# Patient Record
Sex: Female | Born: 1985
Health system: Southern US, Community
[De-identification: ages and names within clinical notes are randomized; demographics above are authoritative.]

## PROBLEM LIST (undated history)

## (undated) ENCOUNTER — Inpatient Hospital Stay (HOSPITAL_COMMUNITY): Payer: Medicaid Other

## (undated) DIAGNOSIS — Z789 Other specified health status: Secondary | ICD-10-CM

## (undated) HISTORY — PX: NO PAST SURGERIES: SHX2092

---

## 2018-01-08 DIAGNOSIS — Z049 Encounter for examination and observation for unspecified reason: Secondary | ICD-10-CM | POA: Diagnosis not present

## 2018-01-09 DIAGNOSIS — Z1388 Encounter for screening for disorder due to exposure to contaminants: Secondary | ICD-10-CM | POA: Diagnosis not present

## 2018-01-09 DIAGNOSIS — Z111 Encounter for screening for respiratory tuberculosis: Secondary | ICD-10-CM | POA: Diagnosis not present

## 2018-01-09 DIAGNOSIS — Z049 Encounter for examination and observation for unspecified reason: Secondary | ICD-10-CM | POA: Diagnosis not present

## 2018-01-09 DIAGNOSIS — Z0389 Encounter for observation for other suspected diseases and conditions ruled out: Secondary | ICD-10-CM | POA: Diagnosis not present

## 2018-01-09 DIAGNOSIS — Z206 Contact with and (suspected) exposure to human immunodeficiency virus [HIV]: Secondary | ICD-10-CM | POA: Diagnosis not present

## 2018-01-09 DIAGNOSIS — Z3009 Encounter for other general counseling and advice on contraception: Secondary | ICD-10-CM | POA: Diagnosis not present

## 2018-01-09 DIAGNOSIS — Z23 Encounter for immunization: Secondary | ICD-10-CM | POA: Diagnosis not present

## 2018-01-21 DIAGNOSIS — Z30013 Encounter for initial prescription of injectable contraceptive: Secondary | ICD-10-CM | POA: Diagnosis not present

## 2018-01-21 DIAGNOSIS — Z01419 Encounter for gynecological examination (general) (routine) without abnormal findings: Secondary | ICD-10-CM | POA: Diagnosis not present

## 2018-01-21 DIAGNOSIS — Z3200 Encounter for pregnancy test, result unknown: Secondary | ICD-10-CM | POA: Diagnosis not present

## 2018-02-03 ENCOUNTER — Other Ambulatory Visit: Payer: Self-pay | Admitting: Internal Medicine

## 2018-02-03 ENCOUNTER — Ambulatory Visit
Admission: RE | Admit: 2018-02-03 | Discharge: 2018-02-03 | Disposition: A | Discharge status: Home / Self Care | Payer: Self-pay | Source: Ambulatory Visit | Attending: Internal Medicine | Admitting: Internal Medicine

## 2018-02-03 DIAGNOSIS — R7612 Nonspecific reaction to cell mediated immunity measurement of gamma interferon antigen response without active tuberculosis: Secondary | ICD-10-CM | POA: Diagnosis not present

## 2018-02-03 DIAGNOSIS — R7611 Nonspecific reaction to tuberculin skin test without active tuberculosis: Secondary | ICD-10-CM

## 2018-02-04 ENCOUNTER — Ambulatory Visit (HOSPITAL_COMMUNITY)
Admission: EM | Admit: 2018-02-04 | Discharge: 2018-02-04 | Disposition: A | Discharge status: Home / Self Care | Payer: Medicaid Other | Attending: Family Medicine | Admitting: Family Medicine

## 2018-02-04 ENCOUNTER — Encounter (HOSPITAL_COMMUNITY): Payer: Self-pay

## 2018-02-04 ENCOUNTER — Other Ambulatory Visit: Payer: Self-pay

## 2018-02-04 DIAGNOSIS — S61412A Laceration without foreign body of left hand, initial encounter: Secondary | ICD-10-CM | POA: Diagnosis not present

## 2018-02-04 DIAGNOSIS — W260XXA Contact with knife, initial encounter: Secondary | ICD-10-CM

## 2018-02-04 MED ORDER — LIDOCAINE HCL 2 % IJ SOLN
INTRAMUSCULAR | Status: AC
  Filled 2018-02-04: qty 20

## 2018-02-04 NOTE — Discharge Instructions (Signed)

## 2018-02-04 NOTE — ED Triage Notes (Signed)
Pt was cutting a bone and ended up stabbing her left hand with a knife.

## 2018-02-05 DIAGNOSIS — S61412A Laceration without foreign body of left hand, initial encounter: Secondary | ICD-10-CM | POA: Diagnosis not present

## 2018-02-05 NOTE — ED Provider Notes (Signed)
[facility]    CSN: [id number] Arrival date & time: 02/04/18  1124     History   Chief Complaint Chief Complaint  Patient presents with  . Laceration    HPI Gina Walton is a 32 y.o. female no significant past medical history presenting today for evaluation of laceration to her left hand.  Patient was trying to cut a bone while cooking and accidentally cut her left hand.  Incident happened a couple hours ago.  Patient denies difficulty moving her fingers.  T[place]anus was believed to be updated upon arrival to the contrary approximately 2 months ago.  She has not cleaned the wound yet.  Patient is accompanied by refugee advisor/helper.  HPI  History reviewed. No pertinent past medical history.  There are no active problems to display for this patient.   History reviewed. No pertinent surgical history.  OB History   None      Home Medications    Prior to Admission medications   Not on File    Family History History reviewed. No pertinent family history.  Social History Social History   Tobacco Use  . Smoking status: Never Smoker  . Smokeless tobacco: Current User  Substance Use Topics  . Alcohol use: Not Currently  . Drug use: Not Currently     Allergies   Patient has no allergy information on record.   Review of Systems Review of Systems  Constitutional: Negative for fatigue and fever.  Eyes: Negative for visual disturbance.  Respiratory: Negative for shortness of breath.   Cardiovascular: Negative for chest pain.  Gastrointestinal: Negative for abdominal pain, nausea and vomiting.  Musculoskeletal: Negative for arthralgias and joint swelling.  Skin: Positive for wound. Negative for color change and rash.  Neurological: Negative for dizziness, weakness, light-headedness and headaches.     Physical Exam Triage Vital Signs ED Triage Vitals  Enc Vitals Group     BP 02/04/18 1208 112/78     Pulse Rate 02/04/18 1208 83     Resp  02/04/18 1208 18     Temp 02/04/18 1208 97.8 F (36.6 C)     Temp Source 02/04/18 1208 Oral     SpO2 02/04/18 1208 100 %     Weight 02/04/18 1209 208 lb (94.3 kg)     Height --      Head Circumference --      Peak Flow --      Pain Score 02/04/18 1244 2     Pain Loc --      Pain Edu? --      Excl. in GC? --    No data found.  Updated Vital Signs BP 112/78 (BP Location: Left Arm)   Pulse 83   Temp 97.8 F (36.6 C) (Oral)   Resp 18   Wt 208 lb (94.3 kg)   SpO2 100%   Visual Acuity Right Eye Distance:   Left Eye Distance:   Bilateral Distance:    Right Eye Near:   Left Eye Near:    Bilateral Near:     Physical Exam  Constitutional: She is oriented to person, place, and time. She appears well-developed and well-nourished.  No acute distress  HENT:  Head: Normocephalic and atraumatic.  Nose: Nose normal.  Eyes: Conjunctivae are normal.  Neck: Neck supple.  Cardiovascular: Normal rate.  Pulmonary/Chest: Effort normal. No respiratory distress.  Abdominal: She exhibits no distension.  Musculoskeletal: Normal range of motion.  Full active range of motion of all fingers on left  hand index wrist  Neurological: She is alert and oriented to person, place, and time.  Skin: Skin is warm and dry.  1.5 cm linear laceration, 2 thenar eminence on left hand, relatively superficial, but is exposing subcutaneous fat, bleeding relatively controlled, but easily bleeds when manipulated  Psychiatric: She has a normal mood and affect.  Nursing note and vitals reviewed.    UC Treatments / Results  Labs (all labs ordered are listed, but only abnormal results are displayed) Labs Reviewed - No data to display  EKG None  Radiology Dg Chest 1 View  Result Date: 02/04/2018 CLINICAL DATA:  Positive PPD EXAM: CHEST  1 VIEW COMPARISON:  None. FINDINGS: The heart size and mediastinal contours are within normal limits. Both lungs are clear. The visualized skeletal structures are  unremarkable. IMPRESSION: No active disease. Electronically Signed   By: Jasmine Pang M.D.   On: 02/04/2018 03:57    Procedures Laceration Repair Date/Time: 02/05/2018 10:02 AM Performed by: Wieters, Junius Creamer, PA-C Authorized by: Eustace Moore, MD   Consent:    Consent obtained:  Verbal   Consent given by:  Patient   Risks discussed:  Infection, pain and poor cosmetic result   Alternatives discussed:  No treatment Anesthesia (see MAR for exact dosages):    Anesthesia method:  Local infiltration   Local anesthetic:  Lidocaine 2% w/o epi Laceration details:    Location:  Hand   Hand location:  L palm   Length (cm):  1.5 Repair type:    Repair type:  Simple Pre-procedure details:    Preparation:  Patient was prepped and draped in usual sterile fashion Exploration:    Hemostasis achieved with:  Direct pressure   Wound exploration: wound explored through full range of motion     Wound extent: no foreign bodies/material noted, no muscle damage noted and no tendon damage noted   Treatment:    Area cleansed with:  Soap and water   Amount of cleaning:  Standard   Visualized foreign bodies/material removed: no   Skin repair:    Repair method:  Sutures   Suture size:  4-0   Suture material:  Prolene   Suture technique:  Simple interrupted   Number of sutures:  3 Approximation:    Approximation:  Close Post-procedure details:    Dressing: Gauze and Coban.   Patient tolerance of procedure:  Tolerated well, no immediate complications   (including critical care time)  Medications Ordered in UC Medications - No data to display  Initial Impression / Assessment and Plan / UC Course  I have reviewed the triage vital signs and the nursing notes.  Pertinent labs & imaging results that were available during my care of the patient were reviewed by me and considered in my medical decision making (see chart for details).     Small laceration to left hand, does not appear to  involve any tendons or ligaments, laceration repaired with 3 sutures.  Return to have those removed in 7 to 10 days.  Discussed wound care, keeping clean and dry.  Monitoring for signs of infection.Discussed strict return precautions. Patient verbalized understanding and is agreeable with plan.  Final Clinical Impressions(s) / UC Diagnoses   Final diagnoses:  Laceration of left hand without foreign body, initial encounter     Discharge Instructions     WOUND CARE Please return in 7 days to have your stitches/staples removed or sooner if you have concerns. Marland Kitchen Keep area clean and dry for 24  hours. Do not remove bandage, if applied. . After 24 hours, remove bandage and wash wound gently with mild soap and warm water. Reapply a new bandage after cleaning wound, if directed. . Continue daily cleansing with soap and water until stitches/staples are removed. . Do not apply any ointments or creams to the wound while stitches/staples are in place, as this may cause delayed healing. . Notify the office if you experience any of the following signs of infection: Swelling, redness, pus drainage, streaking, fever >101.0 F . Notify the office if you experience excessive bleeding that does not stop after 15-20 minutes of constant, firm pressure.    ED Prescriptions    None     Controlled Substance Prescriptions [place] Controlled Substance Registry consulted? Not Applicable   Lew Dawes, [place] 02/05/18 1004

## 2018-02-06 ENCOUNTER — Ambulatory Visit: Payer: Self-pay | Admitting: Family Medicine

## 2018-02-28 DIAGNOSIS — R7612 Nonspecific reaction to cell mediated immunity measurement of gamma interferon antigen response without active tuberculosis: Secondary | ICD-10-CM | POA: Diagnosis not present

## 2018-03-13 ENCOUNTER — Ambulatory Visit: Payer: Medicaid Other | Attending: Family Medicine | Admitting: Family Medicine

## 2018-03-13 ENCOUNTER — Encounter: Payer: Self-pay | Admitting: Family Medicine

## 2018-03-13 VITALS — BP 110/74 | HR 85 | Temp 97.8°F | Ht 62.0 in | Wt 196.4 lb

## 2018-03-13 DIAGNOSIS — H539 Unspecified visual disturbance: Secondary | ICD-10-CM

## 2018-03-13 DIAGNOSIS — H5500 Unspecified nystagmus: Secondary | ICD-10-CM

## 2018-03-13 DIAGNOSIS — R51 Headache: Secondary | ICD-10-CM | POA: Insufficient documentation

## 2018-03-13 NOTE — Progress Notes (Signed)
Patient is here to establish care.   Pt. Is concern with her left eye, pt. Stated there is something in her eye tissue.   Pt. Stated she see things more dark with her left eye.

## 2018-03-13 NOTE — Progress Notes (Signed)
Subjective:    Patient ID: Gina NiemannSabine Walton, female    DOB: 1985/10/03, 32 y.o.   MRN: [id number]   Due to a language barrier, [employer] video interpretation system used at today's visit  HPI       32 year old female new to the practice.  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  Patient however states that over time this is getting larger.  Patient states that she has issues with blurred or dark vision in the left eye.  Patient with occasional frontal headache.  Patient denies dizziness.  Patient has had no prior injury to her eye.  Patient does not have any family history of eye disorders.  Patient states that she was born at full-term.  Patient states that she does not feel the sensation of eye movement generally.      Patient reports no significant past medical history.  Patient reports no known drug allergies.  Patient reports no family history of diabetes, hypertension, heart disease or cancer.  Patient reports no significant illnesses in her family.  Patient has had no surgery.  Patient does not drink or smoke.    Review of Systems  Constitutional: Negative for chills, fatigue and fever.  HENT: Negative for congestion, hearing loss, postnasal drip, rhinorrhea, sinus pressure, sore throat, tinnitus and trouble swallowing.   Eyes: Positive for photophobia, redness (occasional) and visual disturbance. Negative for pain, discharge and itching.  Respiratory: Negative for cough and shortness of breath.   Cardiovascular: Negative for chest pain, palpitations and leg swelling.  Gastrointestinal: Negative for abdominal pain and nausea.  Endocrine: Negative for polydipsia, polyphagia and polyuria.  Genitourinary: Negative for dysuria and frequency.  Musculoskeletal: Negative for arthralgias, back pain, gait problem, joint swelling and myalgias.  Neurological: Positive for headaches (occasional). Negative for dizziness, tremors, seizures, syncope, facial  asymmetry, speech difficulty, weakness, light-headedness and numbness.       Objective:   Physical Exam BP 110/74 (BP Location: Left Arm, Patient Position: Sitting, Cuff Size: Normal)   Pulse 85   Temp 97.8 F (36.6 C) (Oral)   Ht 5\' 2"  (1.575 m)   Wt 196 lb 6.4 oz (89.1 kg)   LMP 02/15/2018   SpO2 100%   BMI 35.92 kg/m Nurse's notes and vital signs reviewed General-well-nourished, well-developed female in no acute distress EENT- patient with normal conjunctiva with the exception of appearance of extra tissue/growth of conjunctiva onto the lower portion of the left medial iris/lens.  Patient has visible resting nystagmus of the left eye and on examination, when patient is asked to gaze to the left, patient has increase in rapid nystagmus and cannot hold left gaze for very long.  Upon questioning, patient states that she gets a pulling sensation at the area of the tissue growth on the left medial eye when she tries to gaze to the left.  Patient's TMs are gray bilaterally, patient with mild edema of the nasal turbinates, normal oropharynx Neck-supple, no lymphadenopathy, no thyromegaly, no carotid bruit.  Patient with some mild increase in fatty tissue in the anterior neck giving appearance of mild thyromegaly Lungs-clear to auscultation bilaterally Cardiovascular-regular rate and rhythm Abdomen-soft, nontender. Neuro- cranial nerves II through XII are grossly intact with the exception of nystagmus      Assessment & Plan:  .1. 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  Patient is being referred to ophthalmology. - Ambulatory referral to Ophthalmology  2. Nystagmus Patient has continuous nystagmus that is made worse with left gaze.  Patient is being referred to ophthalmology and hopefully to a neuro-ophthalmologist.  Patient may need  evaluation for prior stroke/brain injury if no other causes found for her symptoms - Ambulatory referral to Ophthalmology  An After Visit Summary was printed and given to the patient.  Return for follow-up as needed.

## 2018-05-13 DIAGNOSIS — H5501 Congenital nystagmus: Secondary | ICD-10-CM | POA: Diagnosis not present

## 2018-05-13 DIAGNOSIS — H53002 Unspecified amblyopia, left eye: Secondary | ICD-10-CM | POA: Diagnosis not present

## 2018-05-13 DIAGNOSIS — H538 Other visual disturbances: Secondary | ICD-10-CM | POA: Diagnosis not present

## 2018-05-13 DIAGNOSIS — H1189 Other specified disorders of conjunctiva: Secondary | ICD-10-CM | POA: Diagnosis not present

## 2018-05-13 DIAGNOSIS — Q159 Congenital malformation of eye, unspecified: Secondary | ICD-10-CM | POA: Diagnosis not present

## 2018-05-19 DIAGNOSIS — H5213 Myopia, bilateral: Secondary | ICD-10-CM | POA: Diagnosis not present

## 2018-06-25 DIAGNOSIS — H5201 Hypermetropia, right eye: Secondary | ICD-10-CM | POA: Diagnosis not present

## 2018-06-25 DIAGNOSIS — H52223 Regular astigmatism, bilateral: Secondary | ICD-10-CM | POA: Diagnosis not present

## 2018-10-13 DIAGNOSIS — Z3200 Encounter for pregnancy test, result unknown: Secondary | ICD-10-CM | POA: Diagnosis not present

## 2018-10-13 DIAGNOSIS — Z23 Encounter for immunization: Secondary | ICD-10-CM | POA: Diagnosis not present

## 2018-11-21 ENCOUNTER — Other Ambulatory Visit: Payer: Self-pay

## 2018-11-21 ENCOUNTER — Ambulatory Visit: Payer: Medicaid Other | Attending: Family Medicine | Admitting: Family Medicine

## 2018-11-21 ENCOUNTER — Encounter: Payer: Self-pay | Admitting: Family Medicine

## 2018-11-21 ENCOUNTER — Other Ambulatory Visit (HOSPITAL_COMMUNITY)
Admission: RE | Admit: 2018-11-21 | Discharge: 2018-11-21 | Disposition: A | Discharge status: Home / Self Care | Payer: Medicaid Other | Source: Ambulatory Visit | Attending: Family Medicine | Admitting: Family Medicine

## 2018-11-21 VITALS — BP 113/77 | HR 90 | Temp 98.5°F | Ht 62.0 in | Wt 211.2 lb

## 2018-11-21 DIAGNOSIS — Z6838 Body mass index (BMI) 38.0-38.9, adult: Secondary | ICD-10-CM | POA: Insufficient documentation

## 2018-11-21 DIAGNOSIS — Z124 Encounter for screening for malignant neoplasm of cervix: Secondary | ICD-10-CM | POA: Diagnosis not present

## 2018-11-21 NOTE — Patient Instructions (Signed)
° °Calorie Counting for Weight Loss °Calories are units of energy. Your body needs a certain amount of calories from food to keep you going throughout the day. When you eat more calories than your body needs, your body stores the extra calories as fat. When you eat fewer calories than your body needs, your body burns fat to get the energy it needs. °Calorie counting means keeping track of how many calories you eat and drink each day. Calorie counting can be helpful if you need to lose weight. If you make sure to eat fewer calories than your body needs, you should lose weight. Ask your health care provider what a healthy weight is for you. °For calorie counting to work, you will need to eat the right number of calories in a day in order to lose a healthy amount of weight per week. A dietitian can help you determine how many calories you need in a day and will give you suggestions on how to reach your calorie goal. °· A healthy amount of weight to lose per week is usually 1-2 lb (0.5-0.9 kg). This usually means that your daily calorie intake should be reduced by 500-750 calories. °· Eating 1,200 - 1,500 calories per day can help most women lose weight. °· Eating 1,500 - 1,800 calories per day can help most men lose weight. °What is my plan? °My goal is to have __________ calories per day. °If I have this many calories per day, I should lose around __________ pounds per week. °What do I need to know about calorie counting? °In order to meet your daily calorie goal, you will need to: °· Find out how many calories are in each food you would like to eat. Try to do this before you eat. °· Decide how much of the food you plan to eat. °· Write down what you ate and how many calories it had. Doing this is called keeping a food log. °To successfully lose weight, it is important to balance calorie counting with a healthy lifestyle that includes regular activity. Aim for 150 minutes of moderate exercise (such as walking) or 75  minutes of vigorous exercise (such as running) each week. °Where do I find calorie information? ° °The number of calories in a food can be found on a Nutrition Facts label. If a food does not have a Nutrition Facts label, try to look up the calories online or ask your dietitian for help. °Remember that calories are listed per serving. If you choose to have more than one serving of a food, you will have to multiply the calories per serving by the amount of servings you plan to eat. For example, the label on a package of bread might say that a serving size is 1 slice and that there are 90 calories in a serving. If you eat 1 slice, you will have eaten 90 calories. If you eat 2 slices, you will have eaten 180 calories. °How do I keep a food log? °Immediately after each meal, record the following information in your food log: °· What you ate. Don't forget to include toppings, sauces, and other extras on the food. °· How much you ate. This can be measured in cups, ounces, or number of items. °· How many calories each food and drink had. °· The total number of calories in the meal. °Keep your food log near you, such as in a small notebook in your pocket, or use a mobile app or website. Some programs will   calculate calories for you and show you how many calories you have left for the day to meet your goal. °What are some calorie counting tips? ° °· Use your calories on foods and drinks that will fill you up and not leave you hungry: °? Some examples of foods that fill you up are nuts and nut butters, vegetables, lean proteins, and high-fiber foods like whole grains. High-fiber foods are foods with more than 5 g fiber per serving. °? Drinks such as sodas, specialty coffee drinks, alcohol, and juices have a lot of calories, yet do not fill you up. °· Eat nutritious foods and avoid empty calories. Empty calories are calories you get from foods or beverages that do not have many vitamins or protein, such as candy, sweets, and  soda. It is better to have a nutritious high-calorie food (such as an avocado) than a food with few nutrients (such as a bag of chips). °· Know how many calories are in the foods you eat most often. This will help you calculate calorie counts faster. °· Pay attention to calories in drinks. Low-calorie drinks include water and unsweetened drinks. °· Pay attention to nutrition labels for "low fat" or "fat free" foods. These foods sometimes have the same amount of calories or more calories than the full fat versions. They also often have added sugar, starch, or salt, to make up for flavor that was removed with the fat. °· Find a way of tracking calories that works for you. Get creative. Try different apps or programs if writing down calories does not work for you. °What are some portion control tips? °· Know how many calories are in a serving. This will help you know how many servings of a certain food you can have. °· Use a measuring cup to measure serving sizes. You could also try weighing out portions on a kitchen scale. With time, you will be able to estimate serving sizes for some foods. °· Take some time to put servings of different foods on your favorite plates, bowls, and cups so you know what a serving looks like. °· Try not to eat straight from a bag or box. Doing this can lead to overeating. Put the amount you would like to eat in a cup or on a plate to make sure you are eating the right portion. °· Use smaller plates, glasses, and bowls to prevent overeating. °· Try not to multitask (for example, watch TV or use your computer) while eating. If it is time to eat, sit down at a table and enjoy your food. This will help you to know when you are full. It will also help you to be aware of what you are eating and how much you are eating. °What are tips for following this plan? °Reading food labels °· Check the calorie count compared to the serving size. The serving size may be smaller than what you are used to  eating. °· Check the source of the calories. Make sure the food you are eating is high in vitamins and protein and low in saturated and trans fats. °Shopping °· Read nutrition labels while you shop. This will help you make healthy decisions before you decide to purchase your food. °· Make a grocery list and stick to it. °Cooking °· Try to cook your favorite foods in a healthier way. For example, try baking instead of frying. °· Use low-fat dairy products. °Meal planning °· Use more fruits and vegetables. Half of your plate should be   fruits and vegetables. °· Include lean proteins like poultry and fish. °How do I count calories when eating out? °· Ask for smaller portion sizes. °· Consider sharing an entree and sides instead of getting your own entree. °· If you get your own entree, eat only half. Ask for a box at the beginning of your meal and put the rest of your entree in it so you are not tempted to eat it. °· If calories are listed on the menu, choose the lower calorie options. °· Choose dishes that include vegetables, fruits, whole grains, low-fat dairy products, and lean protein. °· Choose items that are boiled, broiled, grilled, or steamed. Stay away from items that are buttered, battered, fried, or served with cream sauce. Items labeled "crispy" are usually fried, unless stated otherwise. °· Choose water, low-fat milk, unsweetened iced tea, or other drinks without added sugar. If you want an alcoholic beverage, choose a lower calorie option such as a glass of wine or light beer. °· Ask for dressings, sauces, and syrups on the side. These are usually high in calories, so you should limit the amount you eat. °· If you want a salad, choose a garden salad and ask for grilled meats. Avoid extra toppings like bacon, cheese, or fried items. Ask for the dressing on the side, or ask for olive oil and vinegar or lemon to use as dressing. °· Estimate how many servings of a food you are given. For example, a serving of  cooked rice is ½ cup or about the size of half a baseball. Knowing serving sizes will help you be aware of how much food you are eating at restaurants. The list below tells you how big or small some common portion sizes are based on everyday objects: °? 1 oz--4 stacked dice. °? 3 oz--1 deck of cards. °? 1 tsp--1 die. °? 1 Tbsp--½ a ping-pong ball. °? 2 Tbsp--1 ping-pong ball. °? ½ cup--½ baseball. °? 1 cup--1 baseball. °Summary °· Calorie counting means keeping track of how many calories you eat and drink each day. If you eat fewer calories than your body needs, you should lose weight. °· A healthy amount of weight to lose per week is usually 1-2 lb (0.5-0.9 kg). This usually means reducing your daily calorie intake by 500-750 calories. °· The number of calories in a food can be found on a Nutrition Facts label. If a food does not have a Nutrition Facts label, try to look up the calories online or ask your dietitian for help. °· Use your calories on foods and drinks that will fill you up, and not on foods and drinks that will leave you hungry. °· Use smaller plates, glasses, and bowls to prevent overeating. °This information is not intended to replace advice given to you by your health care provider. Make sure you discuss any questions you have with your health care provider. °Document Released: 04/09/2005 Document Revised: 12/27/2017 Document Reviewed: 03/09/2016 °Elsevier Patient Education © 2020 [employer] ° °

## 2018-11-21 NOTE — Progress Notes (Signed)
   Subjective:  Patient ID: Gina Walton, female    DOB: Jan 27, 1986  Age: 33 y.o. MRN: [id number]  CC: Gynecologic Exam   HPI Gina Walton presents for a Pap smear.  She denies vaginal discharge, dysuria and has no additional concerns today.  History reviewed. No pertinent past medical history.  History reviewed. No pertinent surgical history.  Family History  Problem Relation Age of Onset  . Hypertension Neg Hx   . Diabetes Neg Hx   . Stroke Neg Hx     No Known Allergies  No outpatient medications prior to visit.   No facility-administered medications prior to visit.      ROS Review of Systems  Constitutional: Negative for activity change, appetite change and fatigue.  HENT: Negative for congestion, sinus pressure and sore throat.   Eyes: Negative for visual disturbance.  Respiratory: Negative for cough, chest tightness, shortness of breath and wheezing.   Cardiovascular: Negative for chest pain and palpitations.  Gastrointestinal: Negative for abdominal distention, abdominal pain and constipation.  Endocrine: Negative for polydipsia.  Genitourinary: Negative for dysuria and frequency.  Musculoskeletal: Negative for arthralgias and back pain.  Skin: Negative for rash.  Neurological: Negative for tremors, light-headedness and numbness.  Hematological: Does not bruise/bleed easily.  Psychiatric/Behavioral: Negative for agitation and behavioral problems.    Objective:  BP 113/77   Pulse 90   Temp 98.5 F (36.9 C) (Oral)   Ht 5\' 2"  (1.575 m)   Wt 211 lb 3.2 oz (95.8 kg)   SpO2 99%   BMI 38.63 kg/m   BP/Weight 11/21/2018 03/13/2018 95/12/3265  Systolic BP 124 580 998  Diastolic BP 77 74 78  Wt. (Lbs) 211.2 196.4 208  BMI 38.63 35.92 -      Physical Exam Constitutional:      Appearance: She is well-developed.  Cardiovascular:     Rate and Rhythm: Normal rate.     Heart sounds: Normal heart sounds. No murmur.  Pulmonary:     Effort: Pulmonary  effort is normal.     Breath sounds: Normal breath sounds. No wheezing or rales.  Chest:     Chest wall: No tenderness.  Abdominal:     General: Bowel sounds are normal. There is no distension.     Palpations: Abdomen is soft. There is no mass.     Tenderness: There is no abdominal tenderness.  Musculoskeletal: Normal range of motion.  Neurological:     Mental Status: She is alert and oriented to person, place, and time.  Psychiatric:        Mood and Affect: Mood normal.       Assessment & Plan:   1. Screening for cervical cancer - Cytology - PAP([place])  2. Morbid obesity ([place]) We will need to reduce portion sizes, increase physical activity, avoid late time eating and eating in front of the TV. Follow-up with PCP regarding this.    No orders of the defined types were placed in this encounter.   Follow-up: Return in about 3 months (around 02/21/2019) for medical conditions with PCP.       Charlott Rakes, MD, FAAFP. [facility] [place], [place]   11/21/2018, 11:03 AM

## 2018-11-25 LAB — CYTOLOGY - PAP
Diagnosis: NEGATIVE
HPV 16/18/45 genotyping: NEGATIVE
HPV: DETECTED — AB

## 2018-11-26 ENCOUNTER — Telehealth: Payer: Self-pay

## 2018-11-26 NOTE — Telephone Encounter (Signed)
-----   Message from Ladell Pier, MD sent at 11/26/2018  1:42 PM EDT ----- Let patient know that her Pap smear did not show any cancer cells which is good.  The test for HPV virus which is the virus that can cause cervical cancer was positive but further testing on this revealed that it was not the subtypes that can lead to cervical cancer.  It is advised however that she has a repeat Pap smear done in 1 year.

## 2018-11-26 NOTE — Telephone Encounter (Signed)
Patient was called and a voicemail was left informing patient to return phone call for lab results. 

## 2018-11-27 NOTE — Telephone Encounter (Signed)
Patient name and DOB has been verified Patient was informed of lab results. Patient had no questions.  

## 2018-11-27 NOTE — Telephone Encounter (Signed)
-----   Message from Deborah B Johnson, MD sent at 11/26/2018  1:42 PM EDT ----- Let patient know that her Pap smear did not show any cancer cells which is good.  The test for HPV virus which is the virus that can cause cervical cancer was positive but further testing on this revealed that it was not the subtypes that can lead to cervical cancer.  It is advised however that she has a repeat Pap smear done in 1 year. 

## 2019-03-11 IMAGING — CR DG CHEST 1V
1 series · 1 of 1 positions shown · non-contrast
Comparison: None.

CLINICAL DATA: Positive PPD

EXAM:
CHEST  1 VIEW

[w chest pa]
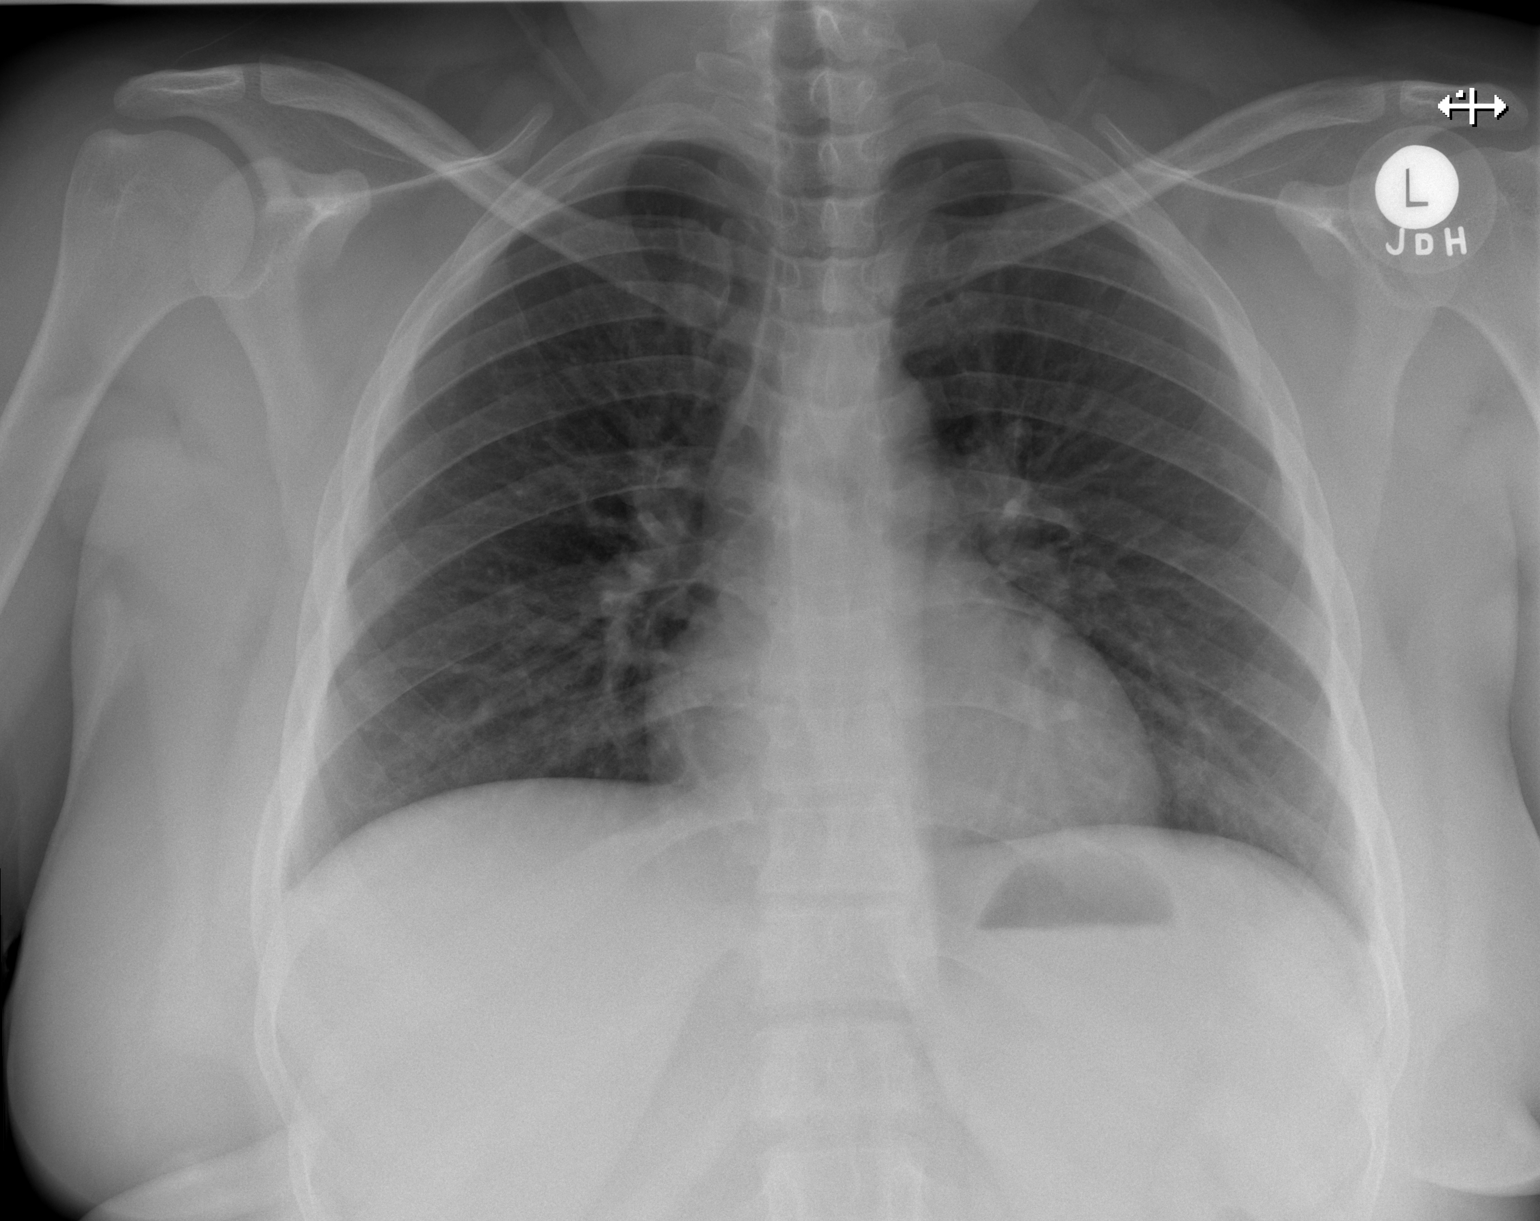

[1 of 1 positions shown; findings below may reference images not displayed]

FINDINGS: The heart size and mediastinal contours are within normal limits.
Both lungs are clear. The visualized skeletal structures are
unremarkable.
IMPRESSION: No active disease.

## 2019-04-24 NOTE — L&D Delivery Note (Signed)
OB/GYN Faculty Practice Delivery Note  Gina Walton is a 34 y.o. [id number] s/p vaginal delivery at [redacted]w[redacted]d. She was admitted for spontaneous onset of labor.   ROM: rupture date, rupture time, delivery date, or delivery time have not been documented with clear fluid GBS Status: negative Maximum Maternal Temperature: 99.58F  Labor Progress: Pt presented in active labor. She had SROM s/p arrival to L&D and progressed to complete cervical dilation with uncomplicated delivery as noted below.  Delivery Date/Time: 3086 on 03/27/20. Delivery: Called to room and patient was complete and pushing. Head delivered ROA. No nuchal cord present. Shoulder and body delivered in usual fashion. Infant with spontaneous cry, placed on mother's abdomen, dried and stimulated. Cord clamped x 2 after 1-minute delay, and cut by FOB under my direct supervision. Cord blood drawn. Placenta delivered spontaneously with gentle cord traction. Fundus firm with massage and Pitocin. Labia, perineum, vagina, and cervix were inspected; notable for second degree perineal laceration s/p repair.   Placenta: 3-vessel cord, intact, sent to L&D Complications: none Lacerations: 2nd degree perineal laceration s/p repair in standard fashion with 3-0 vicryl EBL: 75 ml Analgesia: IV fentanyl + lidocaine for repair  Infant: female  APGARs 8 & 9  3950g  Lynnda Shields, MD OB/GYN Fellow, Faculty Practice

## 2019-05-19 DIAGNOSIS — H538 Other visual disturbances: Secondary | ICD-10-CM | POA: Diagnosis not present

## 2019-05-19 DIAGNOSIS — Q159 Congenital malformation of eye, unspecified: Secondary | ICD-10-CM | POA: Diagnosis not present

## 2019-05-19 DIAGNOSIS — H5501 Congenital nystagmus: Secondary | ICD-10-CM | POA: Diagnosis not present

## 2019-05-19 DIAGNOSIS — H1189 Other specified disorders of conjunctiva: Secondary | ICD-10-CM | POA: Diagnosis not present

## 2019-05-19 DIAGNOSIS — H53002 Unspecified amblyopia, left eye: Secondary | ICD-10-CM | POA: Diagnosis not present

## 2019-07-29 ENCOUNTER — Ambulatory Visit (HOSPITAL_COMMUNITY)
Admission: EM | Admit: 2019-07-29 | Discharge: 2019-07-29 | Disposition: A | Discharge status: Home / Self Care | Payer: Medicaid Other | Attending: Emergency Medicine | Admitting: Emergency Medicine

## 2019-07-29 ENCOUNTER — Encounter (HOSPITAL_COMMUNITY): Payer: Self-pay

## 2019-07-29 DIAGNOSIS — R5383 Other fatigue: Secondary | ICD-10-CM

## 2019-07-29 DIAGNOSIS — R000 Tachycardia, unspecified: Secondary | ICD-10-CM | POA: Diagnosis not present

## 2019-07-29 DIAGNOSIS — R110 Nausea: Secondary | ICD-10-CM | POA: Diagnosis not present

## 2019-07-29 DIAGNOSIS — Z3201 Encounter for pregnancy test, result positive: Secondary | ICD-10-CM | POA: Diagnosis not present

## 2019-07-29 LAB — BASIC METABOLIC PANEL
Anion gap: 11 (ref 5–15)
BUN: 6 mg/dL (ref 6–20)
CO2: 21 mmol/L — ABNORMAL LOW (ref 22–32)
Calcium: 9.5 mg/dL (ref 8.9–10.3)
Chloride: 103 mmol/L (ref 98–111)
Creatinine, Ser: 0.81 mg/dL (ref 0.44–1.00)
GFR calc Af Amer: >60 mL/min (ref >60)
GFR calc non Af Amer: >60 mL/min (ref >60)
Glucose, Bld: 86 mg/dL (ref 70–99)
Potassium: 3.5 mmol/L (ref 3.5–5.1)
Sodium: 135 mmol/L (ref 135–145)

## 2019-07-29 LAB — CBC WITH DIFFERENTIAL/PLATELET
Abs Immature Granulocytes: 0.02 10*3/uL (ref 0.00–0.07)
Basophils Absolute: 0 10*3/uL (ref 0.0–0.1)
Basophils Relative: 1 %
Eosinophils Absolute: 0.2 10*3/uL (ref 0.0–0.5)
Eosinophils Relative: 2 %
HCT: 39.3 % (ref 36.0–46.0)
Hemoglobin: 13.1 g/dL (ref 12.0–15.0)
Immature Granulocytes: 0 %
Lymphocytes Relative: 23 %
Lymphs Abs: 1.9 10*3/uL (ref 0.7–4.0)
MCH: 25.6 pg — ABNORMAL LOW (ref 26.0–34.0)
MCHC: 33.3 g/dL (ref 30.0–36.0)
MCV: 76.8 fL — ABNORMAL LOW (ref 80.0–100.0)
Monocytes Absolute: 0.6 10*3/uL (ref 0.1–1.0)
Monocytes Relative: 7 %
Neutro Abs: 5.7 10*3/uL (ref 1.7–7.7)
Neutrophils Relative %: 67 %
Platelets: 275 10*3/uL (ref 150–400)
RBC: 5.12 MIL/uL — ABNORMAL HIGH (ref 3.87–5.11)
RDW: 13.6 % (ref 11.5–15.5)
WBC: 8.4 10*3/uL (ref 4.0–10.5)
nRBC: 0 % (ref 0.0–0.2)

## 2019-07-29 LAB — TSH: TSH: 2.739 u[IU]/mL (ref 0.350–4.500)

## 2019-07-29 LAB — POCT URINALYSIS DIP (DEVICE)
Bilirubin Urine: NEGATIVE
Glucose, UA: NEGATIVE mg/dL
Ketones, ur: 15 mg/dL — AB
Nitrite: NEGATIVE
Protein, ur: NEGATIVE mg/dL
Specific Gravity, Urine: 1.025 (ref 1.005–1.030)
Urobilinogen, UA: 0.2 mg/dL (ref 0.0–1.0)
pH: 5 (ref 5.0–8.0)

## 2019-07-29 LAB — POCT PREGNANCY, URINE: Preg Test, Ur: POSITIVE — AB

## 2019-07-29 LAB — POC URINE PREG, ED: Preg Test, Ur: POSITIVE — AB

## 2019-07-29 MED ORDER — PRENATAL ADULT GUMMY/DHA/FA 0.4-25 MG PO CHEW: 1.0000 | CHEWABLE_TABLET | Freq: Every day | ORAL | 0 refills | Status: DC

## 2019-07-29 MED ORDER — DOXYLAMINE-PYRIDOXINE 10-10 MG PO TBEC: 2.0000 | DELAYED_RELEASE_TABLET | Freq: Every evening | ORAL | 0 refills | Status: DC | PRN

## 2019-07-29 NOTE — ED Notes (Signed)
I used an interpreter to triage pt.

## 2019-07-29 NOTE — Discharge Instructions (Signed)
Pregnancy test positive, likely cause of many of your symptoms Blood work pending- I will call if abnormal, if normal I will not call  Follow up with OBGYN- contact info below Begin prenatal Diclegis: Two tablets at bedtime on day 1 and 2; if symptoms persist, take 1 tablet in morning and 2 tablets at bedtime on day 3; if symptoms persist, may increase to 1 tablet in morning, 1 tablet mid-afternoon, and 2 tablets at bedtime on day 4 (maximum: doxylamine 40 mg/pyridoxine 40 mg (4 tablets) per day). OR One-half of the 25 mg Unisom sleep tablet over-the-counter tablet or two chewable 5 mg tablets can be used off-label as an antiemetic. In addition, pyridoxine 25 mg, also available over-the-counter, is taken three or four times per day;This is a reasonable, less expensive substitute for combination tablets.  If developing worsening symptoms, abdominal pain, bleeding follow up at [facility] hospital

## 2019-07-29 NOTE — ED Triage Notes (Signed)
Pt c/o rapid heart rate and weaknessx1wk. Pt denies SOB. Pt denies V/D, but has nauseax5 days. Pt has non labored breathing.

## 2019-07-30 NOTE — ED Provider Notes (Signed)
[facility]    CSN: [id number] Arrival date & time: 07/29/19  1706      History   Chief Complaint No chief complaint on file. tachycardia, fatigue, nausea  HPI Swahili interpreter via [employer] interpre[employer] and [employer] interpreters Gina Walton is a 34 y.o. female no significant past medical history presenting today for evaluation of heart palpitations, weakness and nausea.  Patient notes that over the past week she has felt as if her heart has been racing.  This will come and go at times.  Denies sensation of skipping a beat.  Denies chest pain or associated shortness of breath.  She is also felt fatigued and tired.  Denies fevers chills or body aches.  Denies cough congestion or sore throat.  Denies leg pain or leg swelling.  Denies change in appetite or oral intake.  Denies change in fluid intake.  Last menstrual cycle was around 2/28.  Is not on birth control.  Cycles can be irregular.  Denies abdominal pain or changes in bowel movements.  Denies dysuria, increased frequency or urgency.  Denies any new medicines.  HPI  History reviewed. No pertinent past medical history.  There are no problems to display for this patient.   History reviewed. No pertinent surgical history.  OB History   No obstetric history on file.      Home Medications    Prior to Admission medications   Medication Sig Start Date End Date Taking? Authorizing Provider  Doxylamine-Pyridoxine 10-10 MG TBEC Take 2 tablets by mouth at bedtime as needed (nausea). 07/29/19   Assunta Pupo C, PA-C  Prenatal MV & Min w/FA-DHA (PRENATAL ADULT GUMMY/DHA/FA) 0.4-25 MG CHEW Chew 1 tablet by mouth daily. 07/29/19   Ami Thornsberry, Junius Creamer, PA-C    Family History Family History  Problem Relation Age of Onset  . Hypertension Neg Hx   . Diabetes Neg Hx   . Stroke Neg Hx     Social History Social History   Tobacco Use  . Smoking status: Never Smoker  . Smokeless tobacco: Never Used  Substance Use Topics  .  Alcohol use: Never  . Drug use: Never     Allergies   Patient has no known allergies.   Review of Systems Review of Systems  Constitutional: Positive for fatigue. Negative for activity change, appetite change, chills and fever.  HENT: Negative for congestion, ear pain, rhinorrhea, sinus pressure, sore throat and trouble swallowing.   Eyes: Negative for photophobia, pain, discharge, redness and visual disturbance.  Respiratory: Negative for cough, chest tightness and shortness of breath.   Cardiovascular: Positive for palpitations. Negative for chest pain and leg swelling.  Gastrointestinal: Positive for nausea. Negative for abdominal pain, diarrhea and vomiting.  Genitourinary: Negative for decreased urine volume and hematuria.  Musculoskeletal: Negative for myalgias, neck pain and neck stiffness.  Skin: Negative for rash.  Neurological: Negative for dizziness, syncope, facial asymmetry, speech difficulty, weakness, light-headedness, numbness and headaches.     Physical Exam Triage Vital Signs ED Triage Vitals [07/29/19 1731]  Enc Vitals Group     BP 111/85     Pulse Rate (!) 103     Resp 18     Temp 98.7 F (37.1 C)     Temp Source Oral     SpO2 100 %     Weight 211 lb 3.2 oz (95.8 kg)     Height 5' (1.524 m)     Head Circumference      Peak Flow  Pain Score 0     Pain Loc      Pain Edu?      Excl. in [place]?    No data found.  Updated Vital Signs BP 111/85   Pulse (!) 103   Temp 98.7 F (37.1 C) (Oral)   Resp 18   Ht 5' (1.524 m)   Wt 211 lb 3.2 oz (95.8 kg)   SpO2 100%   BMI 41.25 kg/m   Visual Acuity Right Eye Distance:   Left Eye Distance:   Bilateral Distance:    Right Eye Near:   Left Eye Near:    Bilateral Near:     Physical Exam Vitals and nursing note reviewed.  Constitutional:      Appearance: She is well-developed.     Comments: No acute distress  HENT:     Head: Normocephalic and atraumatic.     Ears:     Comments: Bilateral ears  without tenderness to palpation of external auricle, tragus and mastoid, EAC's without erythema or swelling, TM's with good bony landmarks and cone of light. Non erythematous.     Nose: Nose normal.     Mouth/Throat:     Comments: Oral mucosa pink and moist, no tonsillar enlargement or exudate. Posterior pharynx patent and nonerythematous, no uvula deviation or swelling. Normal phonation.  Eyes:     Conjunctiva/sclera: Conjunctivae normal.  Cardiovascular:     Rate and Rhythm: Tachycardia present.  Pulmonary:     Effort: Pulmonary effort is normal. No respiratory distress.     Comments: Breathing comfortably at rest, CTABL, no wheezing, rales or other adventitious sounds auscultated Abdominal:     General: There is no distension.     Comments: Soft, nondistended, nontender to light and palpation throughout abdomen  Musculoskeletal:        General: Normal range of motion.     Cervical back: Neck supple.  Skin:    General: Skin is warm and dry.  Neurological:     General: No focal deficit present.     Mental Status: She is alert and oriented to person, place, and time. Mental status is at baseline.     Cranial Nerves: No cranial nerve deficit.     Motor: No weakness.     Gait: Gait normal.      UC Treatments / Results  Labs (all labs ordered are listed, but only abnormal results are displayed) Labs Reviewed  BASIC METABOLIC PANEL - Abnormal; Notable for the following components:      Result Value   CO2 21 (*)    All other components within normal limits  CBC WITH DIFFERENTIAL/PLATELET - Abnormal; Notable for the following components:   RBC 5.12 (*)    MCV 76.8 (*)    MCH 25.6 (*)    All other components within normal limits  POC URINE PREG, ED - Abnormal; Notable for the following components:   Preg Test, Ur POSITIVE (*)    All other components within normal limits  POCT URINALYSIS DIP (DEVICE) - Abnormal; Notable for the following components:   Ketones, ur 15 (*)     Hgb urine dipstick TRACE (*)    Leukocytes,Ua SMALL (*)    All other components within normal limits  POCT PREGNANCY, URINE - Abnormal; Notable for the following components:   Preg Test, Ur POSITIVE (*)    All other components within normal limits  TSH    EKG   Radiology No results found.  Procedures Procedures (including critical care  time)  Medications Ordered in UC Medications - No data to display  Initial Impression / Assessment and Plan / UC Course  I have reviewed the triage vital signs and the nursing notes.  Pertinent labs & imaging results that were available during my care of the patient were reviewed by me and considered in my medical decision making (see chart for details).    EKG sinus tachycardia at 103, nonspecific T wave inversions noted, but no acute signs of ischemia or infarction, no abnormal beats. Pregnancy test positive, likely explanatory of increased heart rate and fatigue and nausea.  Also checking basic labs of BMP to check glucose, electrolytes, CBC for hemoglobin and TSH.  Advised patient I would only call if abnormal.  Recommend establishing care with OB/GYN, initiating prenatal vitamin, Diclegis for nausea.  Discussed strict return precautions. Patient verbalized understanding and is agreeable with plan.  Final Clinical Impressions(s) / UC Diagnoses   Final diagnoses:  Positive pregnancy test  Tachycardia  Fatigue, unspecified type  Nausea without vomiting     Discharge Instructions     Pregnancy test positive, likely cause of many of your symptoms Blood work pending- I will call if abnormal, if normal I will not call  Follow up with OBGYN- contact info below Begin prenatal Diclegis: Two tablets at bedtime on day 1 and 2; if symptoms persist, take 1 tablet in morning and 2 tablets at bedtime on day 3; if symptoms persist, may increase to 1 tablet in morning, 1 tablet mid-afternoon, and 2 tablets at bedtime on day 4 (maximum: doxylamine  40 mg/pyridoxine 40 mg (4 tablets) per day). OR One-half of the 25 mg Unisom sleep tablet over-the-counter tablet or two chewable 5 mg tablets can be used off-label as an antiemetic. In addition, pyridoxine 25 mg, also available over-the-counter, is taken three or four times per day;This is a reasonable, less expensive substitute for combination tablets.  If developing worsening symptoms, abdominal pain, bleeding follow up at [facility] hospital   ED Prescriptions    Medication Sig Dispense Auth. Provider   Doxylamine-Pyridoxine 10-10 MG TBEC Take 2 tablets by mouth at bedtime as needed (nausea). 60 tablet Althea Backs C, PA-C   Prenatal MV & Min w/FA-DHA (PRENATAL ADULT GUMMY/DHA/FA) 0.4-25 MG CHEW Chew 1 tablet by mouth daily. 30 tablet Dreamer Carillo, [facility] C, PA-C     PDMP not reviewed this encounter.   Lew Dawes, PA-C 07/30/19 1103

## 2019-09-04 ENCOUNTER — Encounter: Payer: Medicaid Other | Admitting: Obstetrics

## 2019-09-07 ENCOUNTER — Encounter: Payer: Medicaid Other | Admitting: Obstetrics & Gynecology

## 2019-09-15 ENCOUNTER — Encounter: Payer: Self-pay | Admitting: Obstetrics & Gynecology

## 2019-09-15 ENCOUNTER — Ambulatory Visit (INDEPENDENT_AMBULATORY_CARE_PROVIDER_SITE_OTHER): Payer: Medicaid Other | Admitting: Obstetrics & Gynecology

## 2019-09-15 ENCOUNTER — Other Ambulatory Visit: Payer: Self-pay

## 2019-09-15 ENCOUNTER — Other Ambulatory Visit (HOSPITAL_COMMUNITY)
Admission: RE | Admit: 2019-09-15 | Discharge: 2019-09-15 | Disposition: A | Discharge status: Home / Self Care | Payer: Medicaid Other | Source: Ambulatory Visit | Attending: Obstetrics | Admitting: Obstetrics

## 2019-09-15 DIAGNOSIS — Z3481 Encounter for supervision of other normal pregnancy, first trimester: Secondary | ICD-10-CM | POA: Diagnosis not present

## 2019-09-15 DIAGNOSIS — Z3A12 12 weeks gestation of pregnancy: Secondary | ICD-10-CM

## 2019-09-15 DIAGNOSIS — Z3A00 Weeks of gestation of pregnancy not specified: Secondary | ICD-10-CM

## 2019-09-15 DIAGNOSIS — Z3480 Encounter for supervision of other normal pregnancy, unspecified trimester: Secondary | ICD-10-CM | POA: Insufficient documentation

## 2019-09-15 MED ORDER — PRENATAL 19 PO CHEW: 1.0000 | CHEWABLE_TABLET | Freq: Every day | ORAL | 2 refills | Status: DC

## 2019-09-15 MED ORDER — ONDANSETRON HCL 4 MG PO TABS: 4.0000 mg | ORAL_TABLET | Freq: Every day | ORAL | 1 refills | Status: DC | PRN

## 2019-09-15 MED FILL — ONDANSETRON HCL 4 MG TABLET: 4 | 30 days supply | Qty: 30 | Fill #0

## 2019-09-15 NOTE — Progress Notes (Signed)
  Subjective:    Gina Walton is a [id number] Unknown LMP est Feb 2021 being seen today for her first obstetrical visit.  Her obstetrical history is significant for grand multipara. Patient does intend to breast feed. Pregnancy history fully reviewed.  Patient reports nausea and vomiting.  Vitals:   09/15/19 1309  BP: 100/69  Pulse: 89  Weight: 87.5 kg    HISTORY: OB History  Gravida Para Term Preterm AB Living  7 6 6     6   SAB TAB Ectopic Multiple Live Births          6    # Outcome Date GA Lbr Len/2nd Weight Sex Delivery Anes PTL Lv  7 Current           6 Term 04/02/15    M Vag-Spont   LIV  5 Term 01/29/13    F Vag-Spont   LIV  4 Term 03/30/10    F Vag-Spont   LIV  3 Term 05/13/08    F Vag-Spont   LIV  2 Term 08/11/06    M Vag-Spont   LIV  1 Term 02/08/05    M   N LIV   History reviewed. No pertinent past medical history. History reviewed. No pertinent surgical history. Family History  Problem Relation Age of Onset  . Hypertension Neg Hx   . Diabetes Neg Hx   . Stroke Neg Hx      Exam    Uterus:     Pelvic Exam:    Perineum: No Hemorrhoids   Vulva: normal   Vagina:  normal mucosa   Cervix: no bleeding following Pap, no cervical motion tenderness and no lesions   Adnexa: normal adnexa and no mass, fullness, tenderness   Bony Pelvis: gynecoid  System: Breast:  normal appearance, no masses or tenderness   Skin: normal coloration and turgor, no rashes    Neurologic: normal   Extremities: normal strength, tone, and muscle mass   HEENT PERRLA   Mouth/Teeth mucous membranes moist, pharynx normal without lesions   Neck supple   Cardiovascular: regular rate and rhythm   Respiratory:  appears well, vitals normal, no respiratory distress, acyanotic, normal RR, ear and throat exam is normal, neck free of mass or lymphadenopathy, chest clear, no wheezing, crepitations, rhonchi, normal symmetric air entry   Abdomen: soft, non-tender; bowel sounds normal; no masses,   no organomegaly   Urinary: urethral meatus normal      Assessment:    Pregnancy: 02/10/05 Patient Active Problem List   Diagnosis Date Noted  . Supervision of other normal pregnancy, antepartum 09/15/2019        Plan:     Initial labs drawn. Prenatal vitamins. Rx zofran for nausea  Problem list reviewed and updated. Genetic Screening discussed Quad Screen: requested.  Ultrasound discussed; fetal survey: requested.  Follow up in 4 weeks. 85% of 30 min visit spent on counseling and coordination of care.  Precautions given.    09/17/2019 09/15/2019

## 2019-09-15 NOTE — Addendum Note (Signed)
Addended by: Natale Milch D on: 09/15/2019 02:19 PM   Modules accepted: Orders

## 2019-09-16 LAB — OBSTETRIC PANEL, INCLUDING HIV
Antibody Screen: NEGATIVE
Basophils Absolute: 0 10*3/uL (ref 0.0–0.2)
Basos: 0 %
EOS (ABSOLUTE): 0.1 10*3/uL (ref 0.0–0.4)
Eos: 1 %
HIV Screen 4th Generation wRfx: NONREACTIVE
Hematocrit: 35.4 % (ref 34.0–46.6)
Hemoglobin: 11.6 g/dL (ref 11.1–15.9)
Hepatitis B Surface Ag: NEGATIVE
Immature Grans (Abs): 0 10*3/uL (ref 0.0–0.1)
Immature Granulocytes: 0 %
Lymphocytes Absolute: 1.2 10*3/uL (ref 0.7–3.1)
Lymphs: 21 %
MCH: 25.9 pg — ABNORMAL LOW (ref 26.6–33.0)
MCHC: 32.8 g/dL (ref 31.5–35.7)
MCV: 79 fL (ref 79–97)
Monocytes Absolute: 0.5 10*3/uL (ref 0.1–0.9)
Monocytes: 8 %
Neutrophils Absolute: 4.1 10*3/uL (ref 1.4–7.0)
Neutrophils: 70 %
Platelets: 220 10*3/uL (ref 150–450)
RBC: 4.48 x10E6/uL (ref 3.77–5.28)
RDW: 14.2 % (ref 11.7–15.4)
RPR Ser Ql: NONREACTIVE
Rh Factor: POSITIVE
Rubella Antibodies, IGG: 14.5 index (ref >0.99)
WBC: 5.8 10*3/uL (ref 3.4–10.8)

## 2019-09-17 LAB — CERVICOVAGINAL ANCILLARY ONLY
Bacterial Vaginitis (gardnerella): NEGATIVE
Candida Glabrata: NEGATIVE
Candida Vaginitis: POSITIVE — AB
Chlamydia: NEGATIVE
Comment: NEGATIVE
Comment: NEGATIVE
Comment: NEGATIVE
Comment: NEGATIVE
Comment: NEGATIVE
Comment: NORMAL
Neisseria Gonorrhea: NEGATIVE
Trichomonas: NEGATIVE

## 2019-09-17 LAB — CULTURE, OB URINE

## 2019-09-17 LAB — URINE CULTURE, OB REFLEX

## 2019-09-17 MED ORDER — FLUCONAZOLE 150 MG PO TABS: 150.0000 mg | ORAL_TABLET | Freq: Once | ORAL | 1 refills | Status: AC

## 2019-09-17 NOTE — Addendum Note (Signed)
Addended by: Raynelle Dick on: 09/17/2019 04:02 PM   Modules accepted: Orders

## 2019-09-18 MED FILL — FLUCONAZOLE 150 MG TABLET: 150 | 1 days supply | Qty: 1 | Fill #0

## 2019-09-23 ENCOUNTER — Encounter: Payer: Self-pay | Admitting: Obstetrics and Gynecology

## 2019-10-13 ENCOUNTER — Encounter: Payer: Self-pay | Admitting: Obstetrics and Gynecology

## 2019-10-13 ENCOUNTER — Other Ambulatory Visit: Payer: Self-pay

## 2019-10-13 ENCOUNTER — Ambulatory Visit (INDEPENDENT_AMBULATORY_CARE_PROVIDER_SITE_OTHER): Payer: Medicaid Other | Admitting: Obstetrics and Gynecology

## 2019-10-13 VITALS — BP 103/68 | HR 96 | Wt 193.6 lb

## 2019-10-13 DIAGNOSIS — Z3480 Encounter for supervision of other normal pregnancy, unspecified trimester: Secondary | ICD-10-CM | POA: Diagnosis not present

## 2019-10-13 DIAGNOSIS — Z3482 Encounter for supervision of other normal pregnancy, second trimester: Secondary | ICD-10-CM

## 2019-10-13 DIAGNOSIS — D573 Sickle-cell trait: Secondary | ICD-10-CM

## 2019-10-13 MED ORDER — POLYETHYLENE GLYCOL 3350 17 G PO PACK: 17.0000 g | PACK | Freq: Every day | ORAL | 0 refills | Status: DC

## 2019-10-13 NOTE — Patient Instructions (Signed)

## 2019-10-13 NOTE — Progress Notes (Signed)
Subjective:  Gina Walton is a 34 y.o. G7P6006 at [redacted]w[redacted]d being seen today for ongoing prenatal care.  She is currently monitored for the following issues for this low-risk pregnancy and has Supervision of other normal pregnancy, antepartum and Sickle cell trait (HCC) on their problem list.  Patient reports constipation.  Contractions: Not present. Vag. Bleeding: None.  Movement: Present. Denies leaking of fluid.   The following portions of the patient's history were reviewed and updated as appropriate: allergies, current medications, past family history, past medical history, past social history, past surgical history and problem list. Problem list updated.  Objective:   Vitals:   10/13/19 1359  BP: 103/68  Pulse: 96  Weight: 193 lb 9.6 oz (87.8 kg)    Fetal Status: Fetal Heart Rate (bpm): 155   Movement: Present     General:  Alert, oriented and cooperative. Patient is in no acute distress.  Skin: Skin is warm and dry. No rash noted.   Cardiovascular: Normal heart rate noted  Respiratory: Normal respiratory effort, no problems with respiration noted  Abdomen: Soft, gravid, appropriate for gestational age. Pain/Pressure: Absent     Pelvic:  Cervical exam deferred        Extremities: Normal range of motion.  Edema: None  Mental Status: Normal mood and affect. Normal behavior. Normal judgment and thought content.   Urinalysis:      Assessment and Plan:  Pregnancy: G7P6006 at [redacted]w[redacted]d  1. Supervision of other normal pregnancy, antepartum Stable - AFP, Serum, Open Spina Bifida  2. Sickle cell trait (HCC) Contact information for genetic conuseling  Preterm labor symptoms and general obstetric precautions including but not limited to vaginal bleeding, contractions, leaking of fluid and fetal movement were reviewed in detail with the patient. Please refer to After Visit Summary for other counseling recommendations.  Return in about 4 weeks (around 11/10/2019) for OB visit, face to  face, any provider.   Hermina Staggers, MD

## 2019-10-13 NOTE — Progress Notes (Signed)
Pt presents for ROB c/o PNV's causing rash

## 2019-10-14 ENCOUNTER — Encounter (HOSPITAL_COMMUNITY): Payer: Self-pay | Admitting: Ophthalmology

## 2019-10-14 ENCOUNTER — Encounter (HOSPITAL_BASED_OUTPATIENT_CLINIC_OR_DEPARTMENT_OTHER): Payer: Self-pay

## 2019-10-14 ENCOUNTER — Ambulatory Visit (HOSPITAL_BASED_OUTPATIENT_CLINIC_OR_DEPARTMENT_OTHER): Admit: 2019-10-14 | Payer: Medicaid Other | Admitting: Ophthalmology

## 2019-10-14 SURGERY — CYST REMOVAL
Anesthesia: General | Laterality: Left

## 2019-10-14 NOTE — H&P (Signed)
Gina Walton is an 34 y.o. female.   Chief Complaint: I have a foreign body sensation and eye irritation in my left eye. 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 No pertinent past medical history.  History reviewed. No pertinent surgical history.  Family History  Problem Relation Age of Onset  . Hypertension Neg Hx   . Diabetes Neg Hx   . Stroke Neg Hx    Social History:  reports that she has never smoked. She has never used smokeless tobacco. She reports that she does not drink alcohol and does not use drugs.  Allergies: No Known Allergies  No medications prior to admission.    No results found for this or any previous visit (from the past 48 hour(s)). No results found.  Review of Systems  Constitutional: Negative.   HENT: Negative.   Eyes:       Conjunctival epibulbar dermoid OS  Respiratory: Negative.   Cardiovascular: Negative.   Gastrointestinal: Negative.   Endocrine: Negative.   Genitourinary: Negative.   Allergic/Immunologic: Negative.   Neurological: Negative.   Hematological: Negative.   Psychiatric/Behavioral: Negative.   All other systems reviewed and are negative.   Last menstrual period 06/21/2019. Physical Exam  HENT:  Head: Normocephalic and atraumatic.  Nose: Nose normal.  Mouth/Throat: Mucous membranes are moist.  Eyes: Pupils are equal, round, and reactive to light.    Cardiovascular: Normal rate and normal pulses.  Respiratory: Effort normal.  GI: Normal appearance.  Musculoskeletal:        General: Normal range of motion.     Cervical back: Normal range of motion.  Neurological: She is alert.  Skin: Skin is warm.  Psychiatric: Her behavior is normal. Mood normal.     Assessment/Plan   SubConjunctival  Epibulbar orbital  Dermoid os:  Plan: Excisional   Removal under general anesthesia with conjunctival  graft .  Aura Camps, MD 10/14/2019, 12:38 PM

## 2019-10-15 LAB — AFP, SERUM, OPEN SPINA BIFIDA
AFP MoM: 1.44
AFP Value: 44.1 ng/mL
Gest. Age on Collection Date: 16.3 weeks
Maternal Age At EDD: 32.3 yr
OSBR Risk 1 IN: 3220
Test Results:: NEGATIVE
Weight: 193 [lb_av]

## 2019-10-20 ENCOUNTER — Other Ambulatory Visit: Payer: Self-pay

## 2019-10-20 ENCOUNTER — Encounter (HOSPITAL_COMMUNITY): Payer: Self-pay | Admitting: Certified Registered Nurse Anesthetist

## 2019-10-20 ENCOUNTER — Encounter (HOSPITAL_COMMUNITY): Payer: Self-pay | Admitting: Ophthalmology

## 2019-10-20 NOTE — Progress Notes (Addendum)
I called Gina Walton using a [employer], ID # [id number]- she left a voice message at Gina home number.  I had Gina Walton call cell number, her husband answered, he said that patient is at work and will not get off until 3 pm, both should be at home by 3:30 PM- call back at that time. I spoke with Gina Diver, RN, pre-op flow nurse and informed her that patient is unable to have Covid test today and that her surgery is scheduled at 0760; Gina Walton asked that I call Gina. [name] to see if it is ok to switch cases.  I called Gina. [name] and spoke with Gina Walton, Gina OR schedule, about switching time with second case patient. Gina Walton said she will call 2nd case scheduler's guardian to see if they can arrive at 0530, she will call me back if times cannot be changed.  I asked Gina Walton to call Gina OR scheduler if it is ok to switch cases.   I called Gina Walton using [address] [place] ID# [id number]. Gina Walton asked if it is safe to have anesthesia when she is pregnant.  I told patient that we do surgery on patient's, I encouraged patient to call her OB and speak with him.  Patient asked if Medicaid will pay for surgery, I asked patient to call Gina Walton and she said they would not be able to understand her, would I call.  I put patient and interpreter on hold and called Gina. [name] and asked if surgery is covered by Medicaid, she said yes and I shared that patient is concerned about having surgery while pregnant, Gina Walton said she will speak to Gina. [name] and call back. I continued Gina call with Gina Walton, Gina Walton called back , I put Gina Walton on hold, Gina Walton said that they were not aware that patient is pregnant, she was seen in January, Gina Walton said  that surgery is cancelled. I told patient through interpreter that surgery is cancelled, patient voiced understanding. I called Gina Walton Interpreter scheduling and left a message.

## 2019-10-21 ENCOUNTER — Ambulatory Visit (HOSPITAL_COMMUNITY): Admission: RE | Admit: 2019-10-21 | Payer: Medicaid Other | Source: Home / Self Care | Admitting: Ophthalmology

## 2019-10-21 HISTORY — DX: Other specified health status: Z78.9

## 2019-10-21 SURGERY — CYST REMOVAL
Anesthesia: General | Laterality: Left

## 2019-11-03 ENCOUNTER — Ambulatory Visit: Payer: Medicaid Other | Attending: Obstetrics and Gynecology

## 2019-11-03 ENCOUNTER — Other Ambulatory Visit: Payer: Self-pay

## 2019-11-03 ENCOUNTER — Other Ambulatory Visit: Payer: Self-pay | Admitting: *Deleted

## 2019-11-03 ENCOUNTER — Ambulatory Visit: Payer: Medicaid Other | Admitting: *Deleted

## 2019-11-03 VITALS — BP 110/78 | HR 94

## 2019-11-03 DIAGNOSIS — O99212 Obesity complicating pregnancy, second trimester: Secondary | ICD-10-CM

## 2019-11-03 DIAGNOSIS — Z363 Encounter for antenatal screening for malformations: Secondary | ICD-10-CM

## 2019-11-03 DIAGNOSIS — O99210 Obesity complicating pregnancy, unspecified trimester: Secondary | ICD-10-CM | POA: Diagnosis not present

## 2019-11-03 DIAGNOSIS — Z3A19 19 weeks gestation of pregnancy: Secondary | ICD-10-CM

## 2019-11-03 DIAGNOSIS — Z862 Personal history of diseases of the blood and blood-forming organs and certain disorders involving the immune mechanism: Secondary | ICD-10-CM

## 2019-11-03 DIAGNOSIS — Z3480 Encounter for supervision of other normal pregnancy, unspecified trimester: Secondary | ICD-10-CM | POA: Insufficient documentation

## 2019-11-03 DIAGNOSIS — E669 Obesity, unspecified: Secondary | ICD-10-CM

## 2019-11-03 DIAGNOSIS — Z362 Encounter for other antenatal screening follow-up: Secondary | ICD-10-CM

## 2019-11-10 ENCOUNTER — Other Ambulatory Visit: Payer: Self-pay

## 2019-11-10 ENCOUNTER — Encounter: Payer: Self-pay | Admitting: Obstetrics

## 2019-11-10 ENCOUNTER — Ambulatory Visit (INDEPENDENT_AMBULATORY_CARE_PROVIDER_SITE_OTHER): Payer: Medicaid Other | Admitting: Obstetrics and Gynecology

## 2019-11-10 ENCOUNTER — Encounter: Payer: Self-pay | Admitting: Obstetrics and Gynecology

## 2019-11-10 VITALS — BP 93/68 | HR 86 | Wt 192.0 lb

## 2019-11-10 DIAGNOSIS — D573 Sickle-cell trait: Secondary | ICD-10-CM

## 2019-11-10 DIAGNOSIS — Z3A20 20 weeks gestation of pregnancy: Secondary | ICD-10-CM

## 2019-11-10 DIAGNOSIS — Z3482 Encounter for supervision of other normal pregnancy, second trimester: Secondary | ICD-10-CM

## 2019-11-10 DIAGNOSIS — Z3480 Encounter for supervision of other normal pregnancy, unspecified trimester: Secondary | ICD-10-CM

## 2019-11-10 NOTE — Progress Notes (Signed)
   PRENATAL VISIT NOTE  Subjective:  Gina Walton is a 34 y.o. G7P6006 at [redacted]w[redacted]d being seen today for ongoing prenatal care.  She is currently monitored for the following issues for this low-risk pregnancy and has Supervision of other normal pregnancy, antepartum and Sickle cell trait (HCC) on their problem list.  Patient reports no complaints.  Contractions: Irritability. Vag. Bleeding: None.  Movement: Present. Denies leaking of fluid.   The following portions of the patient's history were reviewed and updated as appropriate: allergies, current medications, past family history, past medical history, past social history, past surgical history and problem list.   Objective:   Vitals:   11/10/19 1501  BP: 93/68  Pulse: 86  Weight: 192 lb (87.1 kg)    Fetal Status: Fetal Heart Rate (bpm): 153   Movement: Present     General:  Alert, oriented and cooperative. Patient is in no acute distress.  Skin: Skin is warm and dry. No rash noted.   Cardiovascular: Normal heart rate noted  Respiratory: Normal respiratory effort, no problems with respiration noted  Abdomen: Soft, gravid, appropriate for gestational age.  Pain/Pressure: Absent     Pelvic: Cervical exam deferred        Extremities: Normal range of motion.  Edema: None  Mental Status: Normal mood and affect. Normal behavior. Normal judgment and thought content.   Assessment and Plan:  Pregnancy: G7P6006 at [redacted]w[redacted]d 1. Supervision of other normal pregnancy, antepartum - Patient for repeat ultrasound to complete anatomy screen.  Already scheduled for next month.  2. Sickle cell trait (HCC) \  Preterm labor symptoms and general obstetric precautions including but not limited to vaginal bleeding, contractions, leaking of fluid and fetal movement were reviewed in detail with the patient. Please refer to After Visit Summary for other counseling recommendations.   Return in about 4 weeks (around 12/08/2019) for ROB.  Future  Appointments  Date Time Provider Department Center  12/01/2019  3:30 PM [facility] NURSE [facility]  12/01/2019  3:45 PM [facility] US5 [facility]    Johnny Bridge, MD

## 2019-12-01 ENCOUNTER — Other Ambulatory Visit: Payer: Self-pay

## 2019-12-01 ENCOUNTER — Ambulatory Visit: Payer: Medicaid Other | Attending: Obstetrics and Gynecology

## 2019-12-01 ENCOUNTER — Ambulatory Visit: Payer: Medicaid Other | Admitting: *Deleted

## 2019-12-01 DIAGNOSIS — Z3A23 23 weeks gestation of pregnancy: Secondary | ICD-10-CM | POA: Diagnosis not present

## 2019-12-01 DIAGNOSIS — Z3480 Encounter for supervision of other normal pregnancy, unspecified trimester: Secondary | ICD-10-CM | POA: Insufficient documentation

## 2019-12-01 DIAGNOSIS — O99212 Obesity complicating pregnancy, second trimester: Secondary | ICD-10-CM | POA: Diagnosis not present

## 2019-12-01 DIAGNOSIS — Z362 Encounter for other antenatal screening follow-up: Secondary | ICD-10-CM | POA: Diagnosis not present

## 2019-12-01 DIAGNOSIS — E669 Obesity, unspecified: Secondary | ICD-10-CM | POA: Diagnosis not present

## 2019-12-01 DIAGNOSIS — Z862 Personal history of diseases of the blood and blood-forming organs and certain disorders involving the immune mechanism: Secondary | ICD-10-CM | POA: Diagnosis not present

## 2019-12-02 ENCOUNTER — Other Ambulatory Visit: Payer: Self-pay | Admitting: *Deleted

## 2019-12-02 DIAGNOSIS — O99210 Obesity complicating pregnancy, unspecified trimester: Secondary | ICD-10-CM

## 2019-12-08 ENCOUNTER — Other Ambulatory Visit: Payer: Self-pay

## 2019-12-08 ENCOUNTER — Encounter: Payer: Self-pay | Admitting: Family Medicine

## 2019-12-08 ENCOUNTER — Ambulatory Visit (INDEPENDENT_AMBULATORY_CARE_PROVIDER_SITE_OTHER): Payer: Medicaid Other | Admitting: Family Medicine

## 2019-12-08 VITALS — BP 100/70 | HR 98 | Wt 196.0 lb

## 2019-12-08 DIAGNOSIS — Z641 Problems related to multiparity: Secondary | ICD-10-CM

## 2019-12-08 DIAGNOSIS — Z3480 Encounter for supervision of other normal pregnancy, unspecified trimester: Secondary | ICD-10-CM

## 2019-12-08 DIAGNOSIS — Z3A24 24 weeks gestation of pregnancy: Secondary | ICD-10-CM

## 2019-12-08 NOTE — Progress Notes (Signed)
    PRENATAL VISIT NOTE  Subjective:  Gina Walton is a 34 y.o. G7P6006 at [redacted]w[redacted]d being seen today for ongoing prenatal care.  She is currently monitored for the following issues for this low-risk pregnancy and has Supervision of other normal pregnancy, antepartum; Sickle cell trait (HCC); and Grand multipara on their problem list.  Patient reports no complaints.  Contractions: Not present. Vag. Bleeding: None.  Movement: Present. Denies leaking of fluid.   The following portions of the patient's history were reviewed and updated as appropriate: allergies, current medications, past family history, past medical history, past social history, past surgical history and problem list.   Objective:   Vitals:   12/08/19 1544  BP: 100/70  Pulse: 98  Weight: 196 lb (88.9 kg)    Fetal Status: Fetal Heart Rate (bpm): 150   Movement: Present     General:  Alert, oriented and cooperative. Patient is in no acute distress.  Skin: Skin is warm and dry. No rash noted.   Cardiovascular: Normal heart rate noted  Respiratory: Normal respiratory effort, no problems with respiration noted  Abdomen: Soft, gravid, appropriate for gestational age.  Pain/Pressure: Absent     Pelvic: Cervical exam deferred        Extremities: Normal range of motion.  Edema: None  Mental Status: Normal mood and affect. Normal behavior. Normal judgment and thought content.   Assessment and Plan:  Pregnancy: G7P6006 at [redacted]w[redacted]d 1. [redacted] weeks gestation of pregnancy   2. Supervision of other normal pregnancy, antepartum Continue routine prenatal care. Anatomy complete and WNL  3. Grand multipara Desires contraception, though partner does not agree.  Preterm labor symptoms and general obstetric precautions including but not limited to vaginal bleeding, contractions, leaking of fluid and fetal movement were reviewed in detail with the patient. Please refer to After Visit Summary for other counseling recommendations.   Return  in 4 weeks (on 01/05/2020) for in person, 28 wk labs.  Future Appointments  Date Time Provider Department Center  01/05/2020  9:15 AM [facility] None  01/05/2020  9:30 AM Johnny Bridge, MD [facility] None  02/03/2020  8:30 AM [facility] NURSE [facility]  02/03/2020  8:45 AM [facility] US4 [facility]    Reva Bores, MD

## 2019-12-08 NOTE — Progress Notes (Signed)
Pt presents for routine OB visit Used Swahili interpreter [phone]; live interpreter here now  No complaints per pt

## 2019-12-08 NOTE — Patient Instructions (Signed)

## 2020-01-05 ENCOUNTER — Other Ambulatory Visit: Payer: Medicaid Other

## 2020-01-05 ENCOUNTER — Encounter: Payer: Self-pay | Admitting: Obstetrics and Gynecology

## 2020-01-05 ENCOUNTER — Other Ambulatory Visit: Payer: Self-pay

## 2020-01-05 ENCOUNTER — Ambulatory Visit (INDEPENDENT_AMBULATORY_CARE_PROVIDER_SITE_OTHER): Payer: Medicaid Other | Admitting: Obstetrics and Gynecology

## 2020-01-05 VITALS — BP 91/66 | HR 90 | Wt 192.0 lb

## 2020-01-05 DIAGNOSIS — Z23 Encounter for immunization: Secondary | ICD-10-CM

## 2020-01-05 DIAGNOSIS — Z3480 Encounter for supervision of other normal pregnancy, unspecified trimester: Secondary | ICD-10-CM | POA: Diagnosis not present

## 2020-01-05 DIAGNOSIS — Z641 Problems related to multiparity: Secondary | ICD-10-CM

## 2020-01-05 LAB — HEPATITIS C ANTIBODY: HCV Ab: NEGATIVE

## 2020-01-05 NOTE — Patient Instructions (Signed)

## 2020-01-05 NOTE — Progress Notes (Signed)
   PRENATAL VISIT NOTE  Subjective:  Gina Walton is a 34 y.o. G7P6006 at [redacted]w[redacted]d being seen today for ongoing prenatal care.  She is currently monitored for the following issues for this low-risk pregnancy and has Supervision of other normal pregnancy, antepartum; Sickle cell trait (HCC); and Grand multipara on their problem list.  Patient reports bilateral hip pain.  Contractions: Not present. Vag. Bleeding: None.  Movement: Present. Denies leaking of fluid.   The following portions of the patient's history were reviewed and updated as appropriate: allergies, current medications, past family history, past medical history, past social history, past surgical history and problem list.   Objective:   Vitals:   01/05/20 0910  BP: 91/66  Pulse: 90  Weight: 192 lb (87.1 kg)    Fetal Status: Fetal Heart Rate (bpm): 140 Fundal Height: 28 cm Movement: Present     General:  Alert, oriented and cooperative. Patient is in no acute distress.  Skin: Skin is warm and dry. No rash noted.   Cardiovascular: Normal heart rate noted  Respiratory: Normal respiratory effort, no problems with respiration noted  Abdomen: Soft, gravid, appropriate for gestational age.  Pain/Pressure: Absent     Pelvic: Cervical exam deferred        Extremities: Normal range of motion.  Edema: None  Mental Status: Normal mood and affect. Normal behavior. Normal judgment and thought content.   Assessment and Plan:  Pregnancy: G7P6006 at [redacted]w[redacted]d 1. Supervision of other normal pregnancy, antepartum  - 28 week labs drawn today.  - Stretching and pregnancy belt recommended to relieve hip pain. 2. Grand multipara  - Will closely for postpartum hemorrahge at time of delivery.    Preterm labor symptoms and general obstetric precautions including but not limited to vaginal bleeding, contractions, leaking of fluid and fetal movement were reviewed in detail with the patient. Please refer to After Visit Summary for other counseling  recommendations.   Return in about 2 weeks (around 01/19/2020) for LROB.  Future Appointments  Date Time Provider Department Center  02/03/2020  8:30 AM [facility] NURSE [facility]  02/03/2020  8:45 AM [facility] US4 [facility]    Johnny Bridge, MD

## 2020-01-06 LAB — HIV ANTIBODY (ROUTINE TESTING W REFLEX): HIV Screen 4th Generation wRfx: NONREACTIVE

## 2020-01-06 LAB — CBC
Hematocrit: 35.7 % (ref 34.0–46.6)
Hemoglobin: 12.1 g/dL (ref 11.1–15.9)
MCH: 26.8 pg (ref 26.6–33.0)
MCHC: 33.9 g/dL (ref 31.5–35.7)
MCV: 79 fL (ref 79–97)
Platelets: 229 10*3/uL (ref 150–450)
RBC: 4.51 x10E6/uL (ref 3.77–5.28)
RDW: 13.9 % (ref 11.7–15.4)
WBC: 7.9 10*3/uL (ref 3.4–10.8)

## 2020-01-06 LAB — GLUCOSE TOLERANCE, 2 HOURS W/ 1HR
Glucose, 1 hour: 120 mg/dL (ref 65–179)
Glucose, 2 hour: 100 mg/dL (ref 65–152)
Glucose, Fasting: 78 mg/dL (ref 65–91)

## 2020-01-06 LAB — HEPATITIS C ANTIBODY: Hep C Virus Ab: <0.1 s/co ratio (ref 0.0–0.9)

## 2020-01-06 LAB — RPR: RPR Ser Ql: NONREACTIVE

## 2020-01-19 ENCOUNTER — Ambulatory Visit (INDEPENDENT_AMBULATORY_CARE_PROVIDER_SITE_OTHER): Payer: Medicaid Other | Admitting: Advanced Practice Midwife

## 2020-01-19 ENCOUNTER — Encounter: Payer: Self-pay | Admitting: Advanced Practice Midwife

## 2020-01-19 ENCOUNTER — Other Ambulatory Visit: Payer: Self-pay

## 2020-01-19 VITALS — BP 95/68 | HR 91 | Wt 192.0 lb

## 2020-01-19 DIAGNOSIS — M79606 Pain in leg, unspecified: Secondary | ICD-10-CM

## 2020-01-19 DIAGNOSIS — R109 Unspecified abdominal pain: Secondary | ICD-10-CM

## 2020-01-19 DIAGNOSIS — O26893 Other specified pregnancy related conditions, third trimester: Secondary | ICD-10-CM

## 2020-01-19 DIAGNOSIS — Z3480 Encounter for supervision of other normal pregnancy, unspecified trimester: Secondary | ICD-10-CM

## 2020-01-19 MED ORDER — COMFORT FIT MATERNITY SUPP MED MISC: 1.0000 | Freq: Every day | 0 refills | Status: DC

## 2020-01-19 NOTE — Progress Notes (Signed)
ROB [redacted]w[redacted]d  * will need to get B/P at another machine before pt checks out.  CC: Back pain  And Braxton Hicks contractions when walking. Also notes leg pain off and on.

## 2020-01-19 NOTE — Patient Instructions (Signed)
Third Trimester of Pregnancy The third trimester is from week 28 through week 40 (months 7 through 9). The third trimester is a time when the unborn baby (fetus) is growing rapidly. At the end of the ninth month, the fetus is about 20 inches in length and weighs 6-10 pounds. Body changes during your third trimester Your body will continue to go through many changes during pregnancy. The changes vary from woman to woman. During the third trimester:  Your weight will continue to increase. You can expect to gain 25-35 pounds (11-16 kg) by the end of the pregnancy.  You may begin to get stretch marks on your hips, abdomen, and breasts.  You may urinate more often because the fetus is moving lower into your pelvis and pressing on your bladder.  You may develop or continue to have heartburn. This is caused by increased hormones that slow down muscles in the digestive tract.  You may develop or continue to have constipation because increased hormones slow digestion and cause the muscles that push waste through your intestines to relax.  You may develop hemorrhoids. These are swollen veins (varicose veins) in the rectum that can itch or be painful.  You may develop swollen, bulging veins (varicose veins) in your legs.  You may have increased body aches in the pelvis, back, or thighs. This is due to weight gain and increased hormones that are relaxing your joints.  You may have changes in your hair. These can include thickening of your hair, rapid growth, and changes in texture. Some women also have hair loss during or after pregnancy, or hair that feels dry or thin. Your hair will most likely return to normal after your baby is born.  Your breasts will continue to grow and they will continue to become tender. A yellow fluid (colostrum) may leak from your breasts. This is the first milk you are producing for your baby.  Your belly button may stick out.  You may notice more swelling in your hands,  face, or ankles.  You may have increased tingling or numbness in your hands, arms, and legs. The skin on your belly may also feel numb.  You may feel short of breath because of your expanding uterus.  You may have more problems sleeping. This can be caused by the size of your belly, increased need to urinate, and an increase in your body's metabolism.  You may notice the fetus "dropping," or moving lower in your abdomen (lightening).  You may have increased vaginal discharge.  You may notice your joints feel loose and you may have pain around your pelvic bone. What to expect at prenatal visits You will have prenatal exams every 2 weeks until week 36. Then you will have weekly prenatal exams. During a routine prenatal visit:  You will be weighed to make sure you and the baby are growing normally.  Your blood pressure will be taken.  Your abdomen will be measured to track your baby's growth.  The fetal heartbeat will be listened to.  Any test results from the previous visit will be discussed.  You may have a cervical check near your due date to see if your cervix has softened or thinned (effaced).  You will be tested for Group B streptococcus. This happens between 35 and 37 weeks. Your health care provider may ask you:  What your birth plan is.  How you are feeling.  If you are feeling the baby move.  If you have had any abnormal   symptoms, such as leaking fluid, bleeding, severe headaches, or abdominal cramping.  If you are using any tobacco products, including cigarettes, chewing tobacco, and electronic cigarettes.  If you have any questions. Other tests or screenings that may be performed during your third trimester include:  Blood tests that check for low iron levels (anemia).  Fetal testing to check the health, activity level, and growth of the fetus. Testing is done if you have certain medical conditions or if there are problems during the pregnancy.  Nonstress test  (NST). This test checks the health of your baby to make sure there are no signs of problems, such as the baby not getting enough oxygen. During this test, a belt is placed around your belly. The baby is made to move, and its heart rate is monitored during movement. What is false labor? False labor is a condition in which you feel small, irregular tightenings of the muscles in the womb (contractions) that usually go away with rest, changing position, or drinking water. These are called Braxton Hicks contractions. Contractions may last for hours, days, or even weeks before true labor sets in. If contractions come at regular intervals, become more frequent, increase in intensity, or become painful, you should see your health care provider. What are the signs of labor?  Abdominal cramps.  Regular contractions that start at 10 minutes apart and become stronger and more frequent with time.  Contractions that start on the top of the uterus and spread down to the lower abdomen and back.  Increased pelvic pressure and dull back pain.  A watery or bloody mucus discharge that comes from the vagina.  Leaking of amniotic fluid. This is also known as your "water breaking." It could be a slow trickle or a gush. Let your health care provider know if it has a color or strange odor. If you have any of these signs, call your health care provider right away, even if it is before your due date. Follow these instructions at home: Medicines  Follow your health care provider's instructions regarding medicine use. Specific medicines may be either safe or unsafe to take during pregnancy.  Take a prenatal vitamin that contains at least 600 micrograms (mcg) of folic acid.  If you develop constipation, try taking a stool softener if your health care provider approves. Eating and drinking   Eat a balanced diet that includes fresh fruits and vegetables, whole grains, good sources of protein such as meat, eggs, or tofu,  and low-fat dairy. Your health care provider will help you determine the amount of weight gain that is right for you.  Avoid raw meat and uncooked cheese. These carry germs that can cause birth defects in the baby.  If you have low calcium intake from food, talk to your health care provider about whether you should take a daily calcium supplement.  Eat four or five small meals rather than three large meals a day.  Limit foods that are high in fat and processed sugars, such as fried and sweet foods.  To prevent constipation: ? Drink enough fluid to keep your urine clear or pale yellow. ? Eat foods that are high in fiber, such as fresh fruits and vegetables, whole grains, and beans. Activity  Exercise only as directed by your health care provider. Most women can continue their usual exercise routine during pregnancy. Try to exercise for 30 minutes at least 5 days a week. Stop exercising if you experience uterine contractions.  Avoid heavy lifting.  Do   not exercise in extreme heat or humidity, or at high altitudes.  Wear low-heel, comfortable shoes.  Practice good posture.  You may continue to have sex unless your health care provider tells you otherwise. Relieving pain and discomfort  Take frequent breaks and rest with your legs elevated if you have leg cramps or low back pain.  Take warm sitz baths to soothe any pain or discomfort caused by hemorrhoids. Use hemorrhoid cream if your health care provider approves.  Wear a good support bra to prevent discomfort from breast tenderness.  If you develop varicose veins: ? Wear support pantyhose or compression stockings as told by your healthcare provider. ? Elevate your feet for 15 minutes, 3-4 times a day. Prenatal care  Write down your questions. Take them to your prenatal visits.  Keep all your prenatal visits as told by your health care provider. This is important. Safety  Wear your seat belt at all times when driving.  Make  a list of emergency phone numbers, including numbers for family, friends, the hospital, and police and fire departments. General instructions  Avoid cat litter boxes and soil used by cats. These carry germs that can cause birth defects in the baby. If you have a cat, ask someone to clean the litter box for you.  Do not travel far distances unless it is absolutely necessary and only with the approval of your health care provider.  Do not use hot tubs, steam rooms, or saunas.  Do not drink alcohol.  Do not use any products that contain nicotine or tobacco, such as cigarettes and e-cigarettes. If you need help quitting, ask your health care provider.  Do not use any medicinal herbs or unprescribed drugs. These chemicals affect the formation and growth of the baby.  Do not douche or use tampons or scented sanitary pads.  Do not cross your legs for long periods of time.  To prepare for the arrival of your baby: ? Take prenatal classes to understand, practice, and ask questions about labor and delivery. ? Make a trial run to the hospital. ? Visit the hospital and tour the maternity area. ? Arrange for maternity or paternity leave through employers. ? Arrange for family and friends to take care of pets while you are in the hospital. ? Purchase a rear-facing car seat and make sure you know how to install it in your car. ? Pack your hospital bag. ? Prepare the baby's nursery. Make sure to remove all pillows and stuffed animals from the baby's crib to prevent suffocation.  Visit your dentist if you have not gone during your pregnancy. Use a soft toothbrush to brush your teeth and be gentle when you floss. Contact a health care provider if:  You are unsure if you are in labor or if your water has broken.  You become dizzy.  You have mild pelvic cramps, pelvic pressure, or nagging pain in your abdominal area.  You have lower back pain.  You have persistent nausea, vomiting, or  diarrhea.  You have an unusual or bad smelling vaginal discharge.  You have pain when you urinate. Get help right away if:  Your water breaks before 37 weeks.  You have regular contractions less than 5 minutes apart before 37 weeks.  You have a fever.  You are leaking fluid from your vagina.  You have spotting or bleeding from your vagina.  You have severe abdominal pain or cramping.  You have rapid weight loss or weight gain.  You have   shortness of breath with chest pain.  You notice sudden or extreme swelling of your face, hands, ankles, feet, or legs.  Your baby makes fewer than 10 movements in 2 hours.  You have severe headaches that do not go away when you take medicine.  You have vision changes. Summary  The third trimester is from week 28 through week 40, months 7 through 9. The third trimester is a time when the unborn baby (fetus) is growing rapidly.  During the third trimester, your discomfort may increase as you and your baby continue to gain weight. You may have abdominal, leg, and back pain, sleeping problems, and an increased need to urinate.  During the third trimester your breasts will keep growing and they will continue to become tender. A yellow fluid (colostrum) may leak from your breasts. This is the first milk you are producing for your baby.  False labor is a condition in which you feel small, irregular tightenings of the muscles in the womb (contractions) that eventually go away. These are called Braxton Hicks contractions. Contractions may last for hours, days, or even weeks before true labor sets in.  Signs of labor can include: abdominal cramps; regular contractions that start at 10 minutes apart and become stronger and more frequent with time; watery or bloody mucus discharge that comes from the vagina; increased pelvic pressure and dull back pain; and leaking of amniotic fluid. This information is not intended to replace advice given to you by your  health care provider. Make sure you discuss any questions you have with your health care provider. Document Revised: 07/31/2018 Document Reviewed: 05/15/2016 Elsevier Patient Education  2020 [employer]  

## 2020-01-19 NOTE — Progress Notes (Signed)
° °  PRENATAL VISIT NOTE  Subjective:  Gina Walton is a 34 y.o. G7P6006 at [redacted]w[redacted]d being seen today for ongoing prenatal care.  She is currently monitored for the following issues for this low-risk pregnancy and has Supervision of other normal pregnancy, antepartum; Sickle cell trait (HCC); and Grand multipara on their problem list.  Patient reports leg pain, difficulty with work due to hours, abdominal pain with walking.  Contractions: Irritability. Vag. Bleeding: None.  Movement: Present. Denies leaking of fluid.   The following portions of the patient's history were reviewed and updated as appropriate: allergies, current medications, past family history, past medical history, past social history, past surgical history and problem list.   Objective:   Vitals:   01/19/20 0956  Weight: 192 lb (87.1 kg)    Fetal Status: Fetal Heart Rate (bpm): 154   Movement: Present     General:  Alert, oriented and cooperative. Patient is in no acute distress.  Skin: Skin is warm and dry. No rash noted.   Cardiovascular: Normal heart rate noted  Respiratory: Normal respiratory effort, no problems with respiration noted  Abdomen: Soft, gravid, appropriate for gestational age.  Pain/Pressure: Present     Pelvic: Cervical exam deferred        Extremities: Normal range of motion.  Edema: None  Mental Status: Normal mood and affect. Normal behavior. Normal judgment and thought content.   Assessment and Plan:  Pregnancy: G7P6006 at [redacted]w[redacted]d 1. Supervision of other normal pregnancy, antepartum --Anticipatory guidance about next visits/weeks of pregnancy given. --Next visit in 2 weeks, virtual  2. Pregnancy related leg pain in third trimester, antepartum --pain in both legs, at night, wakes her up, no associated edema --Legs symmetrical in size, no edema, no warmth to touch, no evidence of DVT --Likely leg cramps, recommend heat, increased PO fluids, replace electrolytes with sports drinks --Recommend  compression socks during the day for improved circulation --Pt asked to come out of work, discussed and letter provided today to limit hours, pregnancy support belt Rx given, and to follow up in 2 weeks  3. Abdominal pain during pregnancy in third trimester --Rest/ice/heat/warm bath/Tylenol/pregnancy support belt  - Elastic Bandages & Supports (COMFORT FIT MATERNITY SUPP MED) MISC; 1 Device by Does not apply route daily.  Dispense: 1 each; Refill: 0  Preterm labor symptoms and general obstetric precautions including but not limited to vaginal bleeding, contractions, leaking of fluid and fetal movement were reviewed in detail with the patient. Please refer to After Visit Summary for other counseling recommendations.   Return in about 4 weeks (around 02/16/2020).  Future Appointments  Date Time Provider Department Center  02/03/2020  8:30 AM [facility] NURSE [facility]  02/03/2020  8:45 AM [facility] US4 [facility]    Sharen Counter, CNM

## 2020-02-03 ENCOUNTER — Ambulatory Visit: Payer: Medicaid Other | Attending: Obstetrics and Gynecology

## 2020-02-03 ENCOUNTER — Other Ambulatory Visit: Payer: Self-pay | Admitting: *Deleted

## 2020-02-03 ENCOUNTER — Ambulatory Visit: Payer: Medicaid Other

## 2020-02-03 ENCOUNTER — Other Ambulatory Visit: Payer: Self-pay

## 2020-02-03 DIAGNOSIS — O0943 Supervision of pregnancy with grand multiparity, third trimester: Secondary | ICD-10-CM | POA: Diagnosis not present

## 2020-02-03 DIAGNOSIS — Z362 Encounter for other antenatal screening follow-up: Secondary | ICD-10-CM | POA: Diagnosis not present

## 2020-02-03 DIAGNOSIS — O99210 Obesity complicating pregnancy, unspecified trimester: Secondary | ICD-10-CM

## 2020-02-03 DIAGNOSIS — Z3A32 32 weeks gestation of pregnancy: Secondary | ICD-10-CM | POA: Diagnosis not present

## 2020-02-03 DIAGNOSIS — E669 Obesity, unspecified: Secondary | ICD-10-CM

## 2020-02-03 DIAGNOSIS — O3660X0 Maternal care for excessive fetal growth, unspecified trimester, not applicable or unspecified: Secondary | ICD-10-CM

## 2020-02-03 DIAGNOSIS — O99213 Obesity complicating pregnancy, third trimester: Secondary | ICD-10-CM | POA: Diagnosis not present

## 2020-02-03 DIAGNOSIS — Z862 Personal history of diseases of the blood and blood-forming organs and certain disorders involving the immune mechanism: Secondary | ICD-10-CM | POA: Diagnosis not present

## 2020-02-16 ENCOUNTER — Ambulatory Visit (INDEPENDENT_AMBULATORY_CARE_PROVIDER_SITE_OTHER): Payer: Medicaid Other | Admitting: Medical

## 2020-02-16 VITALS — BP 108/75 | HR 100 | Wt 197.0 lb

## 2020-02-16 DIAGNOSIS — Z3480 Encounter for supervision of other normal pregnancy, unspecified trimester: Secondary | ICD-10-CM

## 2020-02-16 DIAGNOSIS — D573 Sickle-cell trait: Secondary | ICD-10-CM

## 2020-02-16 DIAGNOSIS — Z641 Problems related to multiparity: Secondary | ICD-10-CM

## 2020-02-16 NOTE — Patient Instructions (Addendum)
Fetal Movement Counts Patient Name: ________________________________________________ Patient Due Date: ____________________ What is a fetal movement count?  A fetal movement count is the number of times that you feel your baby move during a certain amount of time. This may also be called a fetal kick count. A fetal movement count is recommended for every pregnant woman. You may be asked to start counting fetal movements as early as week 28 of your pregnancy. Pay attention to when your baby is most active. You may notice your baby's sleep and wake cycles. You may also notice things that make your baby move more. You should do a fetal movement count:  When your baby is normally most active.  At the same time each day. A good time to count movements is while you are resting, after having something to eat and drink. How do I count fetal movements? 1. Find a quiet, comfortable area. Sit, or lie down on your side. 2. Write down the date, the start time and stop time, and the number of movements that you felt between those two times. Take this information with you to your health care visits. 3. Write down your start time when you feel the first movement. 4. Count kicks, flutters, swishes, rolls, and jabs. You should feel at least 10 movements. 5. You may stop counting after you have felt 10 movements, or if you have been counting for 2 hours. Write down the stop time. 6. If you do not feel 10 movements in 2 hours, contact your health care provider for further instructions. Your health care provider may want to do additional tests to assess your baby's well-being. Contact a health care provider if:  You feel fewer than 10 movements in 2 hours.  Your baby is not moving like he or she usually does. Date: ____________ Start time: ____________ Stop time: ____________ Movements: ____________ Date: ____________ Start time: ____________ Stop time: ____________ Movements: ____________ Date: ____________  Start time: ____________ Stop time: ____________ Movements: ____________ Date: ____________ Start time: ____________ Stop time: ____________ Movements: ____________ Date: ____________ Start time: ____________ Stop time: ____________ Movements: ____________ Date: ____________ Start time: ____________ Stop time: ____________ Movements: ____________ Date: ____________ Start time: ____________ Stop time: ____________ Movements: ____________ Date: ____________ Start time: ____________ Stop time: ____________ Movements: ____________ Date: ____________ Start time: ____________ Stop time: ____________ Movements: ____________ This information is not intended to replace advice given to you by your health care provider. Make sure you discuss any questions you have with your health care provider. Document Revised: 11/27/2018 Document Reviewed: 11/27/2018 Elsevier Patient Education  2020 Elsevier Inc. Braxton Hicks Contractions Contractions of the uterus can occur throughout pregnancy, but they are not always a sign that you are in labor. You may have practice contractions called Braxton Hicks contractions. These false labor contractions are sometimes confused with true labor. What are Braxton Hicks contractions? Braxton Hicks contractions are tightening movements that occur in the muscles of the uterus before labor. Unlike true labor contractions, these contractions do not result in opening (dilation) and thinning of the cervix. Toward the end of pregnancy (32-34 weeks), Braxton Hicks contractions can happen more often and may become stronger. These contractions are sometimes difficult to tell apart from true labor because they can be very uncomfortable. You should not feel embarrassed if you go to the hospital with false labor. Sometimes, the only way to tell if you are in true labor is for your health care provider to look for changes in the cervix. The health care provider   will do a physical exam and may  monitor your contractions. If you are not in true labor, the exam should show that your cervix is not dilating and your water has not broken. If there are no other health problems associated with your pregnancy, it is completely safe for you to be sent home with false labor. You may continue to have Braxton Hicks contractions until you go into true labor. How to tell the difference between true labor and false labor True labor  Contractions last 30-70 seconds.  Contractions become very regular.  Discomfort is usually felt in the top of the uterus, and it spreads to the lower abdomen and low back.  Contractions do not go away with walking.  Contractions usually become more intense and increase in frequency.  The cervix dilates and gets thinner. False labor  Contractions are usually shorter and not as strong as true labor contractions.  Contractions are usually irregular.  Contractions are often felt in the front of the lower abdomen and in the groin.  Contractions may go away when you walk around or change positions while lying down.  Contractions get weaker and are shorter-lasting as time goes on.  The cervix usually does not dilate or become thin. Follow these instructions at home:   Take over-the-counter and prescription medicines only as told by your health care provider.  Keep up with your usual exercises and follow other instructions from your health care provider.  Eat and drink lightly if you think you are going into labor.  If Braxton Hicks contractions are making you uncomfortable: ? Change your position from lying down or resting to walking, or change from walking to resting. ? Sit and rest in a tub of warm water. ? Drink enough fluid to keep your urine pale yellow. Dehydration may cause these contractions. ? Do slow and deep breathing several times an hour.  Keep all follow-up prenatal visits as told by your health care provider. This is important. Contact a  health care provider if:  You have a fever.  You have continuous pain in your abdomen. Get help right away if:  Your contractions become stronger, more regular, and closer together.  You have fluid leaking or gushing from your vagina.  You pass blood-tinged mucus (bloody show).  You have bleeding from your vagina.  You have low back pain that you never had before.  You feel your baby's head pushing down and causing pelvic pressure.  Your baby is not moving inside you as much as it used to. Summary  Contractions that occur before labor are called Braxton Hicks contractions, false labor, or practice contractions.  Braxton Hicks contractions are usually shorter, weaker, farther apart, and less regular than true labor contractions. True labor contractions usually become progressively stronger and regular, and they become more frequent.  Manage discomfort from [facility] contractions by changing position, resting in a warm bath, drinking plenty of water, or practicing deep breathing. This information is not intended to replace advice given to you by your health care provider. Make sure you discuss any questions you have with your health care provider. Document Revised: 03/22/2017 Document Reviewed: 08/23/2016 Elsevier Patient Education  2020 Elsevier Inc. For pain: Tylenol 500mg  - 1000mg  every 6 hours    AREA PEDIATRIC/FAMILY PRACTICE PHYSICIANS  Central/[place]outheast [place] ( ) . [facility] [id number], MD; [facility], MD; Melodie Bouillon, MD; Lum Babe, MD; McDiarmid, MD; Sheffield Slider, MD; Leveda Anna, MD; Jerene Bears, MD o [address] [place] [place], [place] o (  [phone] o Mon-Fri 8:30-12:30, 1:30-5:00 o Providers come to see babies at [facility] o Accepting Medicaid . [facility] [place] o Limited providers who accept newborns: Docia Chuck, MD; Kateri Plummer, MD; Paulino Rily, MD o [address], [place], [place]  [address] o [phone] o Mon-Fri 8:00-5:30 o Babies seen by providers at [facility] o Does NOT accept Medicaid o Please call early in hospitalization for appointment (limited availability)  . [facility] Fatima Sanger, MD o [address], [place], [place] [address] o [phone] o Mon, Tue, Thur, Fri 8:30-5:00, Wed 10:00-7:00 (closed 1-2pm) o Babies seen by [facility] providers o Accepting Medicaid . Donnie Coffin - Pediatrician Fae Pippin, MD o [address], [place], [place] [address] o [phone] o Mon-Fri 8:30-5:00, Sat 8:30-12:00 o Provider comes to see babies at [facility] o Accepting Medicaid o Must have been referred from current patients or contacted office prior to delivery . [facility]) Leotis Pain, MD; Ave Filter, MD; Luna Fuse, MD; Kennedy Bucker, MD; Konrad Dolores, MD; Kathlene November, MD; Jenne Campus, MD; Lubertha South, MD; Wynetta Emery, MD; Duffy Rhody, MD; Gerre Couch, NP; Shirl Harris, NP o [address] [place], [place], [place] [address] o [phone] o Mon, Tue, Thur, Fri 8:30-5:30, Wed 9:30-5:30, Sat 8:30-12:30 o Babies seen by [facility] providers o Accepting Medicaid o Only accepting infants of first-time parents or siblings of current patients [facility] discharge coordinator will make follow-up appointment . Cyril Mourning o [address], [place], [place]  [address] o [phone]   Fax - [phone] . [facility] o [address], [place], [place]  [address] o Phone - [phone]   Fax - [phone] . Lucio Edward o [address], [place], [place]  [address] o [phone]  [place] [phone]) . [facility] Jorge Mandril, MD; Alita Chyle, MD; Princella Ion, MD; MD; Earlene Plater, MD; Jamesetta Orleans, MD; Alvera Novel, MD; Clarene Duke, MD; Rana Snare, MD; Carmon Ginsberg, MD; Alinda Money, MD; Hosie Poisson, MD; Mayford Knife, MD o [address], [place], [place] [address] o [phone] o Mon-Fri  8:30-5:00 (extended evenings Mon-Thur as needed), Sat-Sun 10:00-1:00 o Providers come to see babies at [facility] o Accepting Medicaid for families of first-time babies and families with all children in the household age 105 and under. Must register with office prior to making appointment (M-F only). [facility] Odella Aquas, NP; Lynelle Doctor, MD; Susann Givens, MD; [place], [place] o [address], [place], [place] [address] o [phone] o Mon-Fri 8:00-5:00 o Babies seen by providers at [facility] o Does NOT accept Medicaid/Commercial Insurance Only . [facility] [place] [facility])  Suzette Battiest, MD; Zachery Dauer, MD; Stefan Church, MD; Sabino Dick, MD; Quitman Livings, MD; Farris Has, MD; Gaynell Face, MD; Betha Loa, MD; Colon Flattery, MD; Clifton James, MD o [address] [place] [place], [place] [address] o [phone] o Mon-Fri 8:30-5:30, Sat (Oct.-Mar.) 9:00-1:00 o Babies seen by providers at [facility] o Accepting [facility] [phone]) . [facility] Dimitri, MD; Sheliah Hatch, MD o [address], [place], [place] [address] o [phone] o Mon-Fri 8:30-5:00, Sat 8:30-12:00 o Providers come to see babies at [facility] o Does NOT accept Medicaid . [facility] Cindy Hazy, [place]; Tracie Harrier, MD; [place], [place]; Wynelle Link, MD; Azucena Cecil, MD o [address] [place], [place] [address] o [phone] o Mon-Fri 8:00-5:00 o Babies seen by providers at [facility] o Does NOT accept Medicaid o  Only accepting babies of parents who are patients o Please call early in hospitalization for appointment (limited availability) . [facility] Lamar Beneso Clark, MD; Abran CantorFrye, MD; Early OsmondKelleher, MD; Cherre HugerMack, NP; Hyacinth MeekerMiller, MD; Dwan Bolt'Keller, MD; Jarold MottoPatterson, NP; Dario GuardianPudlo, MD; Talmage NapPuzio, MD; Maisie Fushomas, MD; Pricilla Holmucker, MD; Tama Highwiselton, MD o [address], [place], [place] [address] o [phone] o Mon-Fri 8:00-5:00, Sat 9:00-12:00 o Providers come  to see babies at [facility] o Does NOT accept [facility]  [place] [phone]) . [facility] o Limited providers accepting new patients: Drema PryBrake, NP; Salem[place], PA o [address], [place], [place] [address] o [phone] o Mon-Fri 8:00-5:00 o Babies seen by providers at [facility] o Does NOT accept Medicaid o Only accepting babies of parents who are patients o Please call early in hospitalization for appointment (limited availability) . [facility] Luan Pullingo Gay, MD; Nash DimmerQuinlan, MD o [address] [place], [place] [address] o [phone] (press 1 to schedule appointment) o Mon-Fri 8:00-5:00 o Providers come to see babies at [facility] o Does NOT accept Medicaid . [facility] Cristino Marteso Mazer, MD o [address] [place], [place] [address] o [phone] o Mon-Fri 8:30-5:00 (lunch 12:30-1:00), extended hours by appointment only Wed 5:00-6:30 o Babies seen by [facility] providers o Accepting Medicaid . [facility] o Banks, MD; Swaz[place]andJordan, MD; Hassan RowanKoberlein, MD o [address], [place], [place] [address] o [phone] o Mon-Fri 8:00-5:00 o Babies seen by [facility] providers o Does NOT accept Medicaid . [facility] [address] [place] Sohoo Parker, MD; Durene CalHunter, MD; GilmanWallace, DO o [address] [place], [place] [address] o [phone] o Mon-Fri 8:00-5:00 o Babies seen by [facility] providers o Does NOT accept Medicaid . [facility] o RamseyBrandon, [place]; [place], [place]; Wilsonvillehristy, NP; Avis Epleyees, MD; Vonna KotykeClaire, MD; Clance BolleWeese, MD; Stevphen RochesterHansen, NP; Arvilla MarketMills, NP; Ann MakiParrish, NP; Otis DialsSmoot, NP; Vaughan BastaSummer, MD; BedfordVapne, MD o [address] [place], [place] [address] o [phone] o Mon-Fri 8:30-5:00, Sat 10:00-1:00 o Providers come to see babies at [facility] o Does NOT accept Medicaid o Free prenatal information session Tuesdays at 4:45pm . [facility] Luna Kitchenso Bouska, MD;  [place], [place]; [place], [place]; Weber, [place] o [address] [place] [address] o [phone] o Mon-Fri 7:30-5:30 o Babies seen by [facility] providers . [facility] o [address], [place], [place]  [address] o [phone]   Fax - [phone]  [place] [phone] & [phone]) . [facility] Alphonsa Overallo Reese, MD o [address] [place], [place] [address] o [phone] o Mon-Thur 8:00-6:00 o Providers come to see babies at [facility] o Accepting Medicaid . [facility] Zenon Mayoo Anderson, NP; Cyndia BentBadger, MD; BonesteelBeal, [place]; SuperiorSpencer, [place] o [address] [place], [place] [address] o [phone] o Mon-Thur 7:30-7:30, Fri 7:30-4:30 o Babies seen by [facility] providers o Accepting Medicaid . [facility] Cheryle Horsfallo Agbuya, MD; Janene HarveyKlett, NP; Vonita Mossomgoolam, MD o [address], [place], [place] [address] o [phone] o Mon-Fri 8:30-5:00, Sat 8:30-12:00 o Providers come to see babies at [facility] o Accepting Medicaid o Must have "Meet & Greet" appointment at office prior to delivery . [facility] [place] [facility] [place]) Llana Alimento McCord, MD; Earlene PlaterWallace, MD; Lucretia RoersWood, MD o [address], [place], [place] [address] o [phone] o Mon-Wed 8:00-6:00, Thur-Fri 8:00-5:00, Sat 9:00-12:00 o Providers come to see babies at [facility] o Does NOT accept Medicaid o  Only accepting siblings of current patients . [facility]  o [address], [place], [place]  [address] o [phone]   Fax - [phone] . [facility] at [facility] o [phone] [address] [place], [place]  [address] o [phone]   Fax - [phone]  [place] [phone] & [phone]) . Nature conservation officer at [facility] o [place], DO; [place], DO o [address] [place], [place] [address] o [phone] o Mon-Fri  7:00-5:00 o Babies seen by [facility] providers o Does NOT accept Medicaid . [facility] Ellis Savage, MD; [place], [place]; [place], [place] o [address], [place], [place] [address] o [phone] o Mon-Fri 8:00-5:00 o Babies seen by [facility] providers o Accepting Medicaid . [facility] - [address] Franne Forts, MD; [place], [place]; [place], NP; [place], [place] o [address] [place], [place], [place] [address] o [phone] o Mon-Fri 8:00-5:00 o Babies seen by providers at [facility] o Accepting [facility] [place] [phone]) . [facility] [place], [place] o [address] [place], [place] [address] o [phone] o Mon-Fri 8:00-5:00 o Babies seen by [facility] providers o Does NOT accept Medicaid o Limited availability, please call early in hospitalization to schedule follow-up . [facility] Jolee Ewing, PA; Eddie Candle, MD; [place], MD; [place], [place]; Constance Goltz, MD; [place], [place] o [address] [facility] [address], [place], [place] [address] o [phone] o Mon-Fri 8:30-5:00, Sat 9:00-12:00 o Babies seen by providers at [facility] o Accepting Medicaid o Please register online then schedule online or call office o [email or web address] . [facility] - [facility]) Samuella Bruin, NP; Lucianne Muss, MD; Lanier Clam, PA o [address], [place], [place] [address] o [phone] o Mon-Fri 8:00-5:00 o Babies seen by providers at [facility] o Accepting Medicaid . [facility] ([facility]) Sharin Mons, MD; Reed Breech, NP; Shelva Majestic, MD o [address], [place], [place] [address] o [phone] o Mon-Fri 8:00-5:30, Sat&Sun by appointment (phones open at 8:30) o Babies seen by [facility] providers o Accepting Medicaid o Must be a first-time baby or sibling of  current patient . [facility] [address], [place], [place]  [address] o [phone]   Fax - [phone]  [place] [phone] & [phone]) . [facility] o [place], [place]; [place], [place]; Dimple Casey, MD; [place], [place]; Carolyne Fiscal, MD o [address], [place], [place] [address] o [phone] o Mon-Thur 8:00-7:00, Fri 8:00-5:00, Sat 8:00-12:00, Sun 9:00-12:00 o Babies seen by [facility] providers o Accepting Medicaid . [facility] at [facility], MD; Gaynell Face, MD; [facility], MD o [address], [place], [place] [address] o [phone] o Mon-Thur 8:00-5:00 o Babies seen by providers at [facility] o Accepting Medicaid . [facility] Gwenlyn Saran, MD; Coe-Goins, MD; Madilyn Fireman, MD; Melvyn Neth, MD; List, MD; Lazarus Salines, MD; Gaynell Face, MD; Berneda Rose, MD; Flora Lipps, MD; Beryl Meager, MD; Luther Redo, MD; Lavonia Drafts, MD; Kellie Simmering, MD o [address] [place], [place] [address] o [phone] o Mon-Fri 8:00-5:30, Sat (Oct.-Mar.) 9:00-1:00 o Babies seen by providers at [facility] o Accepting Medicaid o Must fill out new patient packet, available online at [email or web address] . [facility] [place] [facility] at [facility]) Simone Curia, NP; Tiburcio Pea, NP; Tresa Endo, NP; Whitney Post, MD; [place], [place]; Hennie Duos, MD; Wynne Dust, MD; Kavin Leech, NP o [address], [place], [place] [address] o [phone] o Mon-Thur 8:00-5:30, Fri 8:00-5:00 o Babies seen by providers at [facility] o Accepting [facility] [phone]) . [facility] o [place], [place]; [place], MD; Tanya Nones, MD; [place], [place] o [address] [place], [place] [address] o [phone] o Mon-Fri 8:00-5:00 o Babies seen by providers at [facility] o Accepting [facility] [phone]) . [facility] at [facility] o [place], DO; Lenise Arena, MD;  [place], [place] o [address], [place], [place] [address] o [phone] o Mon-Fri 8:00-5:00 o Babies seen by providers at [facility] o Does NOT accept Medicaid o Limited appointment availability, please call early in hospitalization  . Nature conservation officer at [facility] o [place], DO; Sunny Sl[place], MD o [address] [place], [place] [address] o [phone] o Mon-Fri 8:00-5:00 o Babies seen by [facility] providers o Does NOT accept Medicaid . [facility] Lorrine Kin, MD; Ninetta Lights, MD; [place], [place]; [place], MD o [address] [facility] [address] [facility] [address], [place], [place] [address] o [phone] o Mon-Fri 8:00-5:00 o After hours clinic [facility] [place] [place], [place] [address]) [phone] Mon-Fri 5:00-8:00, Sat 12:00-6:00, Sun 10:00-4:00 o Babies seen by [facility] providers o Accepting Medicaid . [facility] at [facility] o [address] [place], [place]  [address] o [phone]   Fax - [phone]  [place] [phone]) . Nature conservation officer at [facility], MD o [address] [place], [place], [place] [address] o [phone] o Mon-Fri 8:00-5:00 o Babies seen by [facility] providers o Does NOT accept Medicaid . [facility] - [place] [facility] [place]) Tomi Likens, MD o [address] [place] [address], [place], [place] [address] o [phone] o Mon-Thur 8:00-7:00, Fri 8:00-5:00, Sat 8:00-12:00 o Babies seen by providers at [facility] o Accepting Medicaid - but does not have vaccinations in office (must be received elsewhere) o Limited availability, please call early in hospitalization  [place] ([phone]) . [facility]  o Wyvonne Lenz, MD o [address] [place] [address] o [phone]  Fax [phone]

## 2020-02-18 NOTE — Progress Notes (Signed)
   PRENATAL VISIT NOTE  Subjective:  Gina Walton is a 34 y.o. G7P6006 at [redacted]w[redacted]d being seen today for ongoing prenatal care.  She is currently monitored for the following issues for this low-risk pregnancy and has Supervision of other normal pregnancy, antepartum; Sickle cell trait (HCC); and Grand multipara on their problem list.  Patient reports backache, fatigue and occasional contractions.  Contractions: Irritability. Vag. Bleeding: None.  Movement: Present. Denies leaking of fluid.   The following portions of the patient's history were reviewed and updated as appropriate: allergies, current medications, past family history, past medical history, past social history, past surgical history and problem list.   Objective:   Vitals:   02/16/20 1503  BP: 108/75  Pulse: 100  Weight: 197 lb (89.4 kg)    Fetal Status: Fetal Heart Rate (bpm): 157   Movement: Present     General:  Alert, oriented and cooperative. Patient is in no acute distress.  Skin: Skin is warm and dry. No rash noted.   Cardiovascular: Normal heart rate noted  Respiratory: Normal respiratory effort, no problems with respiration noted  Abdomen: Soft, gravid, appropriate for gestational age.  Pain/Pressure: Present     Pelvic: Cervical exam deferred        Extremities: Normal range of motion.  Edema: Trace  Mental Status: Normal mood and affect. Normal behavior. Normal judgment and thought content.   Assessment and Plan:  Pregnancy: G7P6006 at [redacted]w[redacted]d 1. Supervision of other normal pregnancy, antepartum - Peds list given and discussed the importance of choosing prior to 36 weeks - Patient requesting to be written out of work due to back pain, fatigue and contractions  - Advised of how FMLA works and limitations this will place of PP time off, patient agreeable and letter given  - EFW 97% at last [place], follow-up scheudled 11/10 - advised we can consider IOL at 39 weeks if desired  2. Sickle cell trait (HCC)  3. Grand  multipara  Preterm labor symptoms and general obstetric precautions including but not limited to vaginal bleeding, contractions, leaking of fluid and fetal movement were reviewed in detail with the patient. Please refer to After Visit Summary for other counseling recommendations.   Return in about 2 weeks (around 03/01/2020) for LOB, In-Person, any provider.  Future Appointments  Date Time Provider Department Center  03/01/2020  1:40 PM Currie Paris, NP [facility] None  03/02/2020  3:00 PM [facility] NURSE [facility]  03/02/2020  3:15 PM [facility] US2 [facility]    Vonzella Nipple, PA-C

## 2020-03-01 ENCOUNTER — Other Ambulatory Visit: Payer: Self-pay

## 2020-03-01 ENCOUNTER — Ambulatory Visit (INDEPENDENT_AMBULATORY_CARE_PROVIDER_SITE_OTHER): Payer: Medicaid Other | Admitting: Nurse Practitioner

## 2020-03-01 ENCOUNTER — Encounter: Payer: Self-pay | Admitting: Nurse Practitioner

## 2020-03-01 ENCOUNTER — Other Ambulatory Visit (HOSPITAL_COMMUNITY)
Admission: RE | Admit: 2020-03-01 | Discharge: 2020-03-01 | Disposition: A | Discharge status: Home / Self Care | Payer: Medicaid Other | Source: Ambulatory Visit | Attending: Nurse Practitioner | Admitting: Nurse Practitioner

## 2020-03-01 VITALS — BP 123/79 | HR 104 | Wt 199.0 lb

## 2020-03-01 DIAGNOSIS — Z3480 Encounter for supervision of other normal pregnancy, unspecified trimester: Secondary | ICD-10-CM | POA: Insufficient documentation

## 2020-03-01 DIAGNOSIS — Z3A36 36 weeks gestation of pregnancy: Secondary | ICD-10-CM

## 2020-03-01 NOTE — Progress Notes (Signed)
    Subjective:  Gina Walton is a 34 y.o. G7P6006 at [redacted]w[redacted]d being seen today for ongoing prenatal care.  She is currently monitored for the following issues for this low-risk pregnancy and has Supervision of other normal pregnancy, antepartum; Sickle cell trait (HCC); and Grand multipara on their problem list.  Patient reports no complaints.  Contractions: Irritability. Vag. Bleeding: None.  Movement: Present. Denies leaking of fluid.   The following portions of the patient's history were reviewed and updated as appropriate: allergies, current medications, past family history, past medical history, past social history, past surgical history and problem list. Problem list updated.  Objective:   Vitals:   03/01/20 1339  BP: 123/79  Pulse: (!) 104  Weight: 199 lb (90.3 kg)    Fetal Status: Fetal Heart Rate (bpm): 148 Fundal Height: 36 cm Movement: Present  Presentation: Vertex  General:  Alert, oriented and cooperative. Patient is in no acute distress.  Skin: Skin is warm and dry. No rash noted.   Cardiovascular: Normal heart rate noted  Respiratory: Normal respiratory effort, no problems with respiration noted  Abdomen: Soft, gravid, appropriate for gestational age. Pain/Pressure: Absent     Pelvic:  Cervical exam performed Dilation: 1 Effacement (%): Thick Station: -3Vaginal swabs done  Extremities: Normal range of motion.  Edema: None  Mental Status: Normal mood and affect. Normal behavior. Normal judgment and thought content.   Urinalysis:      Assessment and Plan:  Pregnancy: G7P6006 at [redacted]w[redacted]d  1. Supervision of other normal pregnancy, antepartum Reviewed finding a pediatrician - current children do not have a doctor in [place].  Asked interpreter to help her call [facility] and [facility] Discussed contraception - she will ask her husband - does not want more children but husband is concerned about side effects of contraception Info put in AVS and informed her  that PP IUD is available  - Strep Gp B NAA - Cervicovaginal ancillary only( [place])    Preterm labor symptoms and general obstetric precautions including but not limited to vaginal bleeding, contractions, leaking of fluid and fetal movement were reviewed in detail with the patient. Please refer to After Visit Summary for other counseling recommendations.  Return in about 1 week (around 03/08/2020) for in person ROB with interpreter.  Nolene Bernheim, RN, MSN, NP-BC Nurse Practitioner, [facility] for [employer], [facility] 03/01/2020 2:09 PM

## 2020-03-01 NOTE — Patient Instructions (Signed)

## 2020-03-01 NOTE — Progress Notes (Signed)
ROB 36w  CC: None   GBS and CT due today Pt desires cervix check.

## 2020-03-02 ENCOUNTER — Encounter: Payer: Self-pay | Admitting: *Deleted

## 2020-03-02 ENCOUNTER — Ambulatory Visit: Payer: Medicaid Other | Admitting: *Deleted

## 2020-03-02 ENCOUNTER — Ambulatory Visit: Payer: Medicaid Other | Attending: Obstetrics and Gynecology

## 2020-03-02 DIAGNOSIS — D573 Sickle-cell trait: Secondary | ICD-10-CM

## 2020-03-02 DIAGNOSIS — O99213 Obesity complicating pregnancy, third trimester: Secondary | ICD-10-CM | POA: Diagnosis not present

## 2020-03-02 DIAGNOSIS — Z3480 Encounter for supervision of other normal pregnancy, unspecified trimester: Secondary | ICD-10-CM | POA: Insufficient documentation

## 2020-03-02 DIAGNOSIS — Z3A36 36 weeks gestation of pregnancy: Secondary | ICD-10-CM

## 2020-03-02 DIAGNOSIS — O3660X0 Maternal care for excessive fetal growth, unspecified trimester, not applicable or unspecified: Secondary | ICD-10-CM | POA: Insufficient documentation

## 2020-03-02 DIAGNOSIS — Z641 Problems related to multiparity: Secondary | ICD-10-CM

## 2020-03-02 DIAGNOSIS — E669 Obesity, unspecified: Secondary | ICD-10-CM | POA: Diagnosis not present

## 2020-03-02 DIAGNOSIS — Z362 Encounter for other antenatal screening follow-up: Secondary | ICD-10-CM

## 2020-03-02 DIAGNOSIS — O0943 Supervision of pregnancy with grand multiparity, third trimester: Secondary | ICD-10-CM | POA: Diagnosis not present

## 2020-03-02 LAB — CERVICOVAGINAL ANCILLARY ONLY
Chlamydia: NEGATIVE
Comment: NEGATIVE
Comment: NEGATIVE
Comment: NORMAL
Neisseria Gonorrhea: NEGATIVE
Trichomonas: NEGATIVE

## 2020-03-02 NOTE — Progress Notes (Signed)
Pulse 120 by palpation

## 2020-03-03 LAB — STREP GP B NAA: Strep Gp B NAA: NEGATIVE

## 2020-03-08 ENCOUNTER — Encounter: Payer: Self-pay | Admitting: Obstetrics and Gynecology

## 2020-03-08 ENCOUNTER — Other Ambulatory Visit: Payer: Self-pay

## 2020-03-08 ENCOUNTER — Ambulatory Visit (INDEPENDENT_AMBULATORY_CARE_PROVIDER_SITE_OTHER): Payer: Medicaid Other | Admitting: Obstetrics and Gynecology

## 2020-03-08 VITALS — BP 105/73 | HR 82 | Wt 204.0 lb

## 2020-03-08 DIAGNOSIS — Z3480 Encounter for supervision of other normal pregnancy, unspecified trimester: Secondary | ICD-10-CM

## 2020-03-08 DIAGNOSIS — D573 Sickle-cell trait: Secondary | ICD-10-CM

## 2020-03-08 DIAGNOSIS — Z641 Problems related to multiparity: Secondary | ICD-10-CM

## 2020-03-08 NOTE — Progress Notes (Signed)
   PRENATAL VISIT NOTE  Subjective:  Gina Walton is a 34 y.o. G7P6006 at [redacted]w[redacted]d being seen today for ongoing prenatal care.  She is currently monitored for the following issues for this low-risk pregnancy and has Supervision of other normal pregnancy, antepartum; Sickle cell trait (HCC); and Grand multipara on their problem list.  Patient reports occasional contractions.  Contractions: Irregular. Vag. Bleeding: None.  Movement: Present. Denies leaking of fluid.   The following portions of the patient's history were reviewed and updated as appropriate: allergies, current medications, past family history, past medical history, past social history, past surgical history and problem list.   Objective:   Vitals:   03/08/20 0851  BP: 105/73  Pulse: 82  Weight: 204 lb (92.5 kg)    Fetal Status: Fetal Heart Rate (bpm): 138 Fundal Height: 38 cm Movement: Present  Presentation: Vertex  General:  Alert, oriented and cooperative. Patient is in no acute distress.  Skin: Skin is warm and dry. No rash noted.   Cardiovascular: Normal heart rate noted  Respiratory: Normal respiratory effort, no problems with respiration noted  Abdomen: Soft, gravid, appropriate for gestational age.  Pain/Pressure: Present     Pelvic: Cervical exam performed in the presence of a chaperone Dilation: 1 Effacement (%): Thick Station: -3  Extremities: Normal range of motion.  Edema: None  Mental Status: Normal mood and affect. Normal behavior. Normal judgment and thought content.   Assessment and Plan:  Pregnancy: G7P6006 at [redacted]w[redacted]d 1. Supervision of other normal pregnancy, antepartum - Recognition of labor discussed.  2. Grand multipara - Will monitor closely for postpartum hemorrhage at time of delivery.   - Patient unsure of contraception choice at this time.    3. Sickle cell trait (HCC)   Term labor symptoms and general obstetric precautions including but not limited to vaginal bleeding, contractions,  leaking of fluid and fetal movement were reviewed in detail with the patient. Please refer to After Visit Summary for other counseling recommendations.   Return in about 1 week (around 03/15/2020) for ROB.  No future appointments.  Johnny Bridge, MD

## 2020-03-08 NOTE — Progress Notes (Signed)
Pension scheme manager [phone]

## 2020-03-16 ENCOUNTER — Ambulatory Visit (INDEPENDENT_AMBULATORY_CARE_PROVIDER_SITE_OTHER): Payer: Medicaid Other | Admitting: Nurse Practitioner

## 2020-03-16 ENCOUNTER — Other Ambulatory Visit: Payer: Self-pay

## 2020-03-16 VITALS — BP 116/74 | HR 92 | Wt 202.6 lb

## 2020-03-16 DIAGNOSIS — Z3480 Encounter for supervision of other normal pregnancy, unspecified trimester: Secondary | ICD-10-CM

## 2020-03-16 DIAGNOSIS — Z789 Other specified health status: Secondary | ICD-10-CM

## 2020-03-16 DIAGNOSIS — Z3A38 38 weeks gestation of pregnancy: Secondary | ICD-10-CM

## 2020-03-16 NOTE — Progress Notes (Signed)
    Subjective:  Gina Walton is a 34 y.o. G7P6006 at [redacted]w[redacted]d being seen today for ongoing prenatal care.  She is currently monitored for the following issues for this low-risk pregnancy and has Supervision of other normal pregnancy, antepartum; Sickle cell trait (HCC); Grand multipara; and Language barrier on their problem list.  Video interpreter used for provider interaction with client.  Patient reports periodic back pain.  Contractions: Irregular. Vag. Bleeding: None.  Movement: Present. Denies leaking of fluid.   The following portions of the patient's history were reviewed and updated as appropriate: allergies, current medications, past family history, past medical history, past social history, past surgical history and problem list. Problem list updated.  Objective:   Vitals:   03/16/20 1441  BP: 116/74  Pulse: 92  Weight: 202 lb 9.6 oz (91.9 kg)    Fetal Status: Fetal Heart Rate (bpm): 141 Fundal Height: 39 cm Movement: Present     General:  Alert, oriented and cooperative. Patient is in no acute distress.  Skin: Skin is warm and dry. No rash noted.   Cardiovascular: Normal heart rate noted  Respiratory: Normal respiratory effort, no problems with respiration noted  Abdomen: Soft, gravid, appropriate for gestational age. Pain/Pressure: Present     Pelvic:  Cervical exam deferred        Extremities: Normal range of motion.  Edema: Trace  Mental Status: Normal mood and affect. Normal behavior. Normal judgment and thought content.   Urinalysis:      Assessment and Plan:  Pregnancy: G7P6006 at [redacted]w[redacted]d  1. Supervision of other normal pregnancy, antepartum Doing well.  Has periodic back pain when she thinks labor is starting but then it stops.  Is ready for labor. Baby moving well. No questions today.  Term labor symptoms and general obstetric precautions including but not limited to vaginal bleeding, contractions, leaking of fluid and fetal movement were reviewed in detail  with the patient. Please refer to After Visit Summary for other counseling recommendations.  Return in about 1 week (around 03/23/2020) for in person ROB.  Nolene Bernheim, RN, MSN, NP-BC Nurse Practitioner, [facility] for [employer], [facility] 03/16/2020 4:42 PM

## 2020-03-16 NOTE — Progress Notes (Signed)
Pt reports fetal movement with irregular contractions. 

## 2020-03-25 ENCOUNTER — Ambulatory Visit (INDEPENDENT_AMBULATORY_CARE_PROVIDER_SITE_OTHER): Payer: Medicaid Other | Admitting: Obstetrics and Gynecology

## 2020-03-25 ENCOUNTER — Encounter (HOSPITAL_COMMUNITY): Payer: Self-pay | Admitting: *Deleted

## 2020-03-25 ENCOUNTER — Telehealth (HOSPITAL_COMMUNITY): Payer: Self-pay | Admitting: *Deleted

## 2020-03-25 ENCOUNTER — Other Ambulatory Visit: Payer: Self-pay

## 2020-03-25 VITALS — BP 117/78 | HR 93 | Wt 202.2 lb

## 2020-03-25 DIAGNOSIS — D573 Sickle-cell trait: Secondary | ICD-10-CM

## 2020-03-25 DIAGNOSIS — Z3A39 39 weeks gestation of pregnancy: Secondary | ICD-10-CM

## 2020-03-25 DIAGNOSIS — Z641 Problems related to multiparity: Secondary | ICD-10-CM

## 2020-03-25 DIAGNOSIS — Z3480 Encounter for supervision of other normal pregnancy, unspecified trimester: Secondary | ICD-10-CM

## 2020-03-25 DIAGNOSIS — Z789 Other specified health status: Secondary | ICD-10-CM

## 2020-03-25 NOTE — Patient Instructions (Signed)

## 2020-03-25 NOTE — Progress Notes (Signed)
   PRENATAL VISIT NOTE  Subjective:  Gina Walton is a 34 y.o. G7P6006 at [redacted]w[redacted]d being seen today for ongoing prenatal care.  She is currently monitored for the following issues for this low-risk pregnancy and has Supervision of other normal pregnancy, antepartum; Sickle cell trait (HCC); Grand multipara; Language barrier; and [redacted] weeks gestation of pregnancy on their problem list.  Patient doing well with no acute concerns today. She reports occasional contractions.  Contractions: Irregular. Vag. Bleeding: None.  Movement: Present. Denies leaking of fluid.   The following portions of the patient's history were reviewed and updated as appropriate: allergies, current medications, past family history, past medical history, past social history, past surgical history and problem list. Problem list updated.  Objective:   Vitals:   03/25/20 0948  BP: 117/78  Pulse: 93  Weight: 202 lb 3.2 oz (91.7 kg)    Fetal Status: Fetal Heart Rate (bpm): 142 Fundal Height: 40 cm Movement: Present  Presentation: Vertex  General:  Alert, oriented and cooperative. Patient is in no acute distress.  Skin: Skin is warm and dry. No rash noted.   Cardiovascular: Normal heart rate noted  Respiratory: Normal respiratory effort, no problems with respiration noted  Abdomen: Soft, gravid, appropriate for gestational age.  Pain/Pressure: Present     Pelvic: Cervical exam performed Dilation: 2.5 Effacement (%): 60 Station: -3  Extremities: Normal range of motion.     Mental Status:  Normal mood and affect. Normal behavior. Normal judgment and thought content.   Assessment and Plan:  Pregnancy: G7P6006 at [redacted]w[redacted]d  1. Language barrier Interpreter present  2. Grand multipara   3. Sickle cell trait (HCC)   4. Supervision of other normal pregnancy, antepartum Last u/s showed EFW at 85% Pt scheduled for IOL at 41 weeks, orders placed, OB visit with NST next week  5. [redacted] weeks gestation of pregnancy   Term  labor symptoms and general obstetric precautions including but not limited to vaginal bleeding, contractions, leaking of fluid and fetal movement were reviewed in detail with the patient.  Please refer to After Visit Summary for other counseling recommendations.   Return in about 6 days (around 03/31/2020) for ROB, in person, with NST.   Mariel Aloe, MD

## 2020-03-25 NOTE — Telephone Encounter (Signed)
Preadmission screen Interpreter number [phone]

## 2020-03-26 ENCOUNTER — Encounter (HOSPITAL_COMMUNITY): Payer: Self-pay | Admitting: Obstetrics and Gynecology

## 2020-03-26 ENCOUNTER — Inpatient Hospital Stay (HOSPITAL_COMMUNITY)
Admission: AD | Admit: 2020-03-26 | Discharge: 2020-03-28 | DRG: 807 | Disposition: A | Discharge status: Home / Self Care | Payer: Medicaid Other | Attending: Obstetrics and Gynecology | Admitting: Obstetrics and Gynecology

## 2020-03-26 ENCOUNTER — Other Ambulatory Visit: Payer: Self-pay

## 2020-03-26 DIAGNOSIS — O4202 Full-term premature rupture of membranes, onset of labor within 24 hours of rupture: Secondary | ICD-10-CM | POA: Diagnosis not present

## 2020-03-26 DIAGNOSIS — Z3A40 40 weeks gestation of pregnancy: Secondary | ICD-10-CM | POA: Diagnosis not present

## 2020-03-26 DIAGNOSIS — D573 Sickle-cell trait: Secondary | ICD-10-CM | POA: Diagnosis not present

## 2020-03-26 DIAGNOSIS — Z3A39 39 weeks gestation of pregnancy: Secondary | ICD-10-CM

## 2020-03-26 DIAGNOSIS — Z20822 Contact with and (suspected) exposure to covid-19: Secondary | ICD-10-CM | POA: Diagnosis not present

## 2020-03-26 DIAGNOSIS — Z641 Problems related to multiparity: Secondary | ICD-10-CM

## 2020-03-26 DIAGNOSIS — O26893 Other specified pregnancy related conditions, third trimester: Secondary | ICD-10-CM | POA: Diagnosis not present

## 2020-03-26 DIAGNOSIS — Z758 Other problems related to medical facilities and other health care: Secondary | ICD-10-CM | POA: Diagnosis present

## 2020-03-26 DIAGNOSIS — O9902 Anemia complicating childbirth: Secondary | ICD-10-CM | POA: Diagnosis not present

## 2020-03-26 DIAGNOSIS — Z789 Other specified health status: Secondary | ICD-10-CM | POA: Diagnosis present

## 2020-03-26 LAB — CBC
HCT: 39.3 % (ref 36.0–46.0)
Hemoglobin: 12.9 g/dL (ref 12.0–15.0)
MCH: 24.8 pg — ABNORMAL LOW (ref 26.0–34.0)
MCHC: 32.8 g/dL (ref 30.0–36.0)
MCV: 75.6 fL — ABNORMAL LOW (ref 80.0–100.0)
Platelets: 288 10*3/uL (ref 150–400)
RBC: 5.2 MIL/uL — ABNORMAL HIGH (ref 3.87–5.11)
RDW: 14.9 % (ref 11.5–15.5)
WBC: 11.2 10*3/uL — ABNORMAL HIGH (ref 4.0–10.5)
nRBC: 0 % (ref 0.0–0.2)

## 2020-03-26 LAB — TYPE AND SCREEN
ABO/RH(D): A POS
Antibody Screen: NEGATIVE

## 2020-03-26 MED ORDER — SOD CITRATE-CITRIC ACID 500-334 MG/5ML PO SOLN: 30.0000 mL | ORAL | Status: DC | PRN

## 2020-03-26 MED ORDER — ACETAMINOPHEN 325 MG PO TABS
650.0000 mg | ORAL_TABLET | ORAL | Status: DC | PRN
  Administered 2020-03-27: 650 mg via ORAL
  Filled 2020-03-26: qty 2

## 2020-03-26 MED ORDER — LACTATED RINGERS IV SOLN: 500.0000 mL | INTRAVENOUS | Status: DC | PRN

## 2020-03-26 MED ORDER — OXYTOCIN-SODIUM CHLORIDE 30-0.9 UT/500ML-% IV SOLN
2.5000 [IU]/h | INTRAVENOUS | Status: DC
  Administered 2020-03-27: 2.5 [IU]/h via INTRAVENOUS

## 2020-03-26 MED ORDER — FENTANYL CITRATE (PF) 100 MCG/2ML IJ SOLN: 50.0000 ug | INTRAMUSCULAR | Status: DC | PRN

## 2020-03-26 MED ORDER — ONDANSETRON HCL 4 MG/2ML IJ SOLN: 4.0000 mg | Freq: Four times a day (QID) | INTRAMUSCULAR | Status: DC | PRN

## 2020-03-26 MED ORDER — LIDOCAINE HCL (PF) 1 % IJ SOLN
30.0000 mL | INTRAMUSCULAR | Status: AC | PRN
  Administered 2020-03-27: 30 mL via SUBCUTANEOUS
  Filled 2020-03-26: qty 30

## 2020-03-26 MED ORDER — OXYCODONE-ACETAMINOPHEN 5-325 MG PO TABS: 1.0000 | ORAL_TABLET | ORAL | Status: DC | PRN

## 2020-03-26 MED ORDER — OXYCODONE-ACETAMINOPHEN 5-325 MG PO TABS: 2.0000 | ORAL_TABLET | ORAL | Status: DC | PRN

## 2020-03-26 MED ORDER — LACTATED RINGERS IV SOLN: INTRAVENOUS | Status: DC

## 2020-03-26 MED ORDER — OXYTOCIN-SODIUM CHLORIDE 30-0.9 UT/500ML-% IV SOLN
INTRAVENOUS | Status: AC
  Filled 2020-03-26: qty 500

## 2020-03-26 MED ORDER — OXYTOCIN BOLUS FROM INFUSION
333.0000 mL | Freq: Once | INTRAVENOUS | Status: AC
  Administered 2020-03-27: 333 mL via INTRAVENOUS

## 2020-03-26 NOTE — MAU Note (Signed)
..  Gina Walton is a 34 y.o. at [redacted]w[redacted]d here in MAU reporting: ctx 1-2 mins. Denies vaginal bleeding or leaking of fluid.

## 2020-03-26 NOTE — H&P (Addendum)
OBSTETRIC ADMISSION HISTORY AND PHYSICAL  Gina Walton is a 34 y.o. female [phone] with IUP at [redacted]w[redacted]d by LMP presenting in active labor. She reports +FMs, No LOF, no VB, no blurry vision, headaches or peripheral edema, and RUQ pain.  She plans on breast and bottle feeding. She is considering OCPs for birth control; however she is still in discussions with her partner regarding contraception.  She received her prenatal care at [facility]   Dating: By LMP --->  Estimated Date of Delivery: 03/27/20  Sono:  @[redacted]w[redacted]d , CWD, normal anatomy, Cephalic presentation, 3283g, EFW  Prenatal History/Complications: None -Language Barrier (Swahili) -Sickle Cell Trait  Past Medical History: Past Medical History:  Diagnosis Date  . Medical history non-contributory     Past Surgical History: Past Surgical History:  Procedure Laterality Date  . NO PAST SURGERIES      Obstetrical History: OB History    Gravida  7   Para  6   Term  6   Preterm      AB      Living  6     SAB      TAB      Ectopic      Multiple      Live Births  6           Social History Social History   Socioeconomic History  . Marital status: Married    Spouse name: Not on file  . Number of children: Not on file  . Years of education: Not on file  . Highest education level: Not on file  Occupational History  . Not on file  Tobacco Use  . Smoking status: Never Smoker  . Smokeless tobacco: Never Used  Vaping Use  . Vaping Use: Never used  Substance and Sexual Activity  . Alcohol use: Never  . Drug use: Never  . Sexual activity: Yes    Birth control/protection: None  Other Topics Concern  . Not on file  Social History Narrative  . Not on file   Social Determinants of Health   Financial Resource Strain:   . Difficulty of Paying Living Expenses: Not on file  Food Insecurity:   . Worried About 08% in the Last Year: Not on file  . Ran Out of Food in the Last Year: Not on file   Transportation Needs:   . Lack of Transportation (Medical): Not on file  . Lack of Transportation (Non-Medical): Not on file  Physical Activity:   . Days of Exercise per Week: Not on file  . Minutes of Exercise per Session: Not on file  Stress:   . Feeling of Stress : Not on file  Social Connections:   . Frequency of Communication with Friends and Family: Not on file  . Frequency of Social Gatherings with Friends and Family: Not on file  . Attends Religious Services: Not on file  . Active Member of Clubs or Organizations: Not on file  . Attends Programme researcher, broadcasting/film/video Meetings: Not on file  . Marital Status: Not on file    Family History: Family History  Problem Relation Age of Onset  . Hypertension Neg Hx   . Diabetes Neg Hx   . Stroke Neg Hx     Allergies: No Known Allergies  Medications Prior to Admission  Medication Sig Dispense Refill Last Dose  . Prenatal Vit-Fe Fumarate-FA (PRENATAL 19) tablet Chew 1 tablet by mouth daily. 90 tablet 2 03/26/2020 at Unknown time  . Doxylamine-Pyridoxine  10-10 MG TBEC Take 2 tablets by mouth at bedtime as needed (nausea). (Patient not taking: Reported on 09/15/2019) 60 tablet 0 Unknown at Unknown time  . Elastic Bandages & Supports (COMFORT FIT MATERNITY SUPP MED) MISC 1 Device by Does not apply route daily. (Patient not taking: Reported on 03/16/2020) 1 each 0 Unknown at Unknown time  . ondansetron (ZOFRAN) 4 MG tablet Take 1 tablet (4 mg total) by mouth daily as needed for nausea or vomiting. (Patient not taking: Reported on 10/13/2019) 30 tablet 1 Unknown at Unknown time  . polyethylene glycol (MIRALAX MIX-IN PAX) 17 g packet Take 17 g by mouth daily. (Patient not taking: Reported on 11/03/2019) 14 each 0 Unknown at Unknown time     Review of Systems   All systems reviewed and negative except as stated in HPI  Blood pressure 128/85, pulse (!) 135, weight 91 kg, last menstrual period 06/21/2019, SpO2 100 %. General appearance: alert and  cooperative Lungs:no extra work of breathing Heart: palpable distal pulses Abdomen: soft, non-tender Pelvic: defered Presentation: cephalic per U/S on 03/02/2020 Fetal monitoringBaseline: 145 bpm, Variability: Fair (1-6 bpm), Accelerations: Reactive and Decelerations: Absent Uterine activityFrequency: Every 1-2 minutes Dilation: 6 Effacement (%): 90 Station: -2 Exam by:: Manuela Neptune, RN   Prenatal labs: ABO, Rh: A/Positive/-- (05/25 1441) Antibody: Negative (05/25 1441) Rubella: 14.50 (05/25 1441) RPR: Non Reactive (09/14 0941)  HBsAg: Negative (05/25 1441)  HIV: Non Reactive (09/14 0941)  GBS: Negative/-- (11/09 0208)  1 hr Glucola normal 2hr GTT Genetic screening  NIPS: low risks, AFP negative Anatomy US normal  Prenatal Transfer Tool  Maternal Diabetes: No Genetic Screening: Normal Maternal Ultrasounds/Referrals: Normal Fetal Ultrasounds or other Referrals:  None Maternal Substance Abuse:  No Significant Maternal Medications:  None Significant Maternal Lab Results: Group B Strep negative  No results found for this or any previous visit (from the past 24 hour(s)).  Patient Active Problem List   Diagnosis Date Noted  . [redacted] weeks gestation of pregnancy 03/25/2020  . Language barrier 03/16/2020  . Grand multipara 12/08/2019  . Sickle cell trait (HCC) 10/13/2019  . Supervision of other normal pregnancy, antepartum 09/15/2019    Assessment/Plan:  Gina Walton is a 34 y.o. G7P6006 at [redacted]w[redacted]d here for active labor. On admission, cervical exam of 6/90/-2 and CTX every 1-2 minutes.  #Labor: Active labor; will plan for expectant management and augment as clinically indicated. #Pain: PRN IV meds per pt request.  #FWB: Cat I strip #ID: GBS negative #MOF: Breast and Bottle #MOC: pt in ongoing discussion with her partner. Currently undecided but considering OCPs.  Nilsa Nutting, Medical Student  03/26/2020, 10:38 PM  Attestation of Supervision of Student:  I confirm  that I have verified the information documented in the medical student's note and that I have also personally reperformed the history, physical exam and all medical decision making activities.  I have verified that all services and findings are accurately documented in this student's note; and I agree with management and plan as outlined in the documentation. I have also made any necessary editorial changes.  Sheila Oats, MD Center for [facility], [facility] 03/27/2020 9:59 AM

## 2020-03-27 ENCOUNTER — Encounter (HOSPITAL_COMMUNITY): Payer: Self-pay | Admitting: Obstetrics and Gynecology

## 2020-03-27 DIAGNOSIS — Z3A40 40 weeks gestation of pregnancy: Secondary | ICD-10-CM | POA: Diagnosis not present

## 2020-03-27 DIAGNOSIS — O4202 Full-term premature rupture of membranes, onset of labor within 24 hours of rupture: Secondary | ICD-10-CM | POA: Diagnosis not present

## 2020-03-27 LAB — RESP PANEL BY RT-PCR (FLU A&B, COVID) ARPGX2
Influenza A by PCR: NEGATIVE
Influenza B by PCR: NEGATIVE
SARS Coronavirus 2 by RT PCR: NEGATIVE

## 2020-03-27 LAB — RPR: RPR Ser Ql: NONREACTIVE

## 2020-03-27 MED ORDER — DIBUCAINE (PERIANAL) 1 % EX OINT: 1.0000 "application " | TOPICAL_OINTMENT | CUTANEOUS | Status: DC | PRN

## 2020-03-27 MED ORDER — MAGNESIUM HYDROXIDE 400 MG/5ML PO SUSP: 30.0000 mL | ORAL | Status: DC | PRN

## 2020-03-27 MED ORDER — ACETAMINOPHEN 325 MG PO TABS
650.0000 mg | ORAL_TABLET | ORAL | Status: DC | PRN
  Administered 2020-03-27 (×2): 650 mg via ORAL
  Filled 2020-03-27 (×2): qty 2

## 2020-03-27 MED ORDER — PRENATAL MULTIVITAMIN CH
1.0000 | ORAL_TABLET | Freq: Every day | ORAL | Status: DC
  Administered 2020-03-27 – 2020-03-28 (×2): 1 via ORAL
  Filled 2020-03-27 (×2): qty 1

## 2020-03-27 MED ORDER — IBUPROFEN 600 MG PO TABS
600.0000 mg | ORAL_TABLET | Freq: Four times a day (QID) | ORAL | Status: DC
  Administered 2020-03-27 – 2020-03-28 (×5): 600 mg via ORAL
  Filled 2020-03-27 (×5): qty 1

## 2020-03-27 MED ORDER — SIMETHICONE 80 MG PO CHEW: 80.0000 mg | CHEWABLE_TABLET | ORAL | Status: DC | PRN

## 2020-03-27 MED ORDER — ONDANSETRON HCL 4 MG/2ML IJ SOLN: 4.0000 mg | INTRAMUSCULAR | Status: DC | PRN

## 2020-03-27 MED ORDER — WITCH HAZEL-GLYCERIN EX PADS: 1.0000 "application " | MEDICATED_PAD | CUTANEOUS | Status: DC | PRN

## 2020-03-27 MED ORDER — BENZOCAINE-MENTHOL 20-0.5 % EX AERO: 1.0000 "application " | INHALATION_SPRAY | CUTANEOUS | Status: DC | PRN

## 2020-03-27 MED ORDER — TETANUS-DIPHTH-ACELL PERTUSSIS 5-2.5-18.5 LF-MCG/0.5 IM SUSY: 0.5000 mL | PREFILLED_SYRINGE | Freq: Once | INTRAMUSCULAR | Status: DC

## 2020-03-27 MED ORDER — SENNOSIDES-DOCUSATE SODIUM 8.6-50 MG PO TABS
2.0000 | ORAL_TABLET | ORAL | Status: DC
  Administered 2020-03-27: 2 via ORAL
  Filled 2020-03-27: qty 2

## 2020-03-27 MED ORDER — ONDANSETRON HCL 4 MG PO TABS: 4.0000 mg | ORAL_TABLET | ORAL | Status: DC | PRN

## 2020-03-27 MED ORDER — COCONUT OIL OIL: 1.0000 "application " | TOPICAL_OIL | Status: DC | PRN

## 2020-03-27 MED ORDER — MEASLES, MUMPS & RUBELLA VAC IJ SOLR: 0.5000 mL | Freq: Once | INTRAMUSCULAR | Status: DC

## 2020-03-27 MED ORDER — DIPHENHYDRAMINE HCL 25 MG PO CAPS: 25.0000 mg | ORAL_CAPSULE | Freq: Four times a day (QID) | ORAL | Status: DC | PRN

## 2020-03-27 NOTE — Progress Notes (Signed)
In room with Pt and Barnetta Chapel, NP. Using interpretor we asked pt around what time her membranes ruptured and patient states it was around 0100 last night. Patient states she does not remember describing factors like color.

## 2020-03-27 NOTE — Lactation Note (Signed)
This note was copied from a baby's chart. Lactation Consultation Note  Patient Name: Gina Walton ZWCHE'N Date: 03/27/2020 Reason for consult: Term  P7 mother whose infant is now 71 hours old.  This is a term baby at 40+0 weeks.  Mother breast fed all of her other children for approximately 6 months each.  Her youngest is almost 32 years old.  Mother's feeding preference is breast/bottle.  Swahili interpreter [phone]) used for interpretation.  Baby was swaddled and asleep in the bassinet when I arrived.  Mother had no immediate questions/concerns.  Reviewed feeding cues.  Mother will feed 8-12 times/24 hours or sooner if baby shows feeding cues.  Asked her to call for latch assistance as needed and showed her how to use her call bell for help.  Encouraged hand expression and feeding back any EBM she obtains to baby.  Mother verbalized understanding.  Mom made aware of O/P services, breastfeeding support groups, community resources, and our phone # for post-discharge questions. Father present.    Mother does not have a DEBP for home use, but has never required one.  She is a [facility] participant and will contact WIC if needed regarding a DEBP.   Maternal Data Formula Feeding for Exclusion: Yes Reason for exclusion: Mother's choice to formula and breast feed on admission Has patient been taught Hand Expression?: Yes Does the patient have breastfeeding experience prior to this delivery?: Yes  Feeding Feeding Type: Breast Fed  LATCH Score                   Interventions    Lactation Tools Discussed/Used WIC Program: Yes   Consult Status Consult Status: Follow-up Date: 03/28/20 Follow-up type: In-patient    Celvin Taney R Jazzmon Prindle 03/27/2020, 10:15 AM

## 2020-03-27 NOTE — Discharge Instructions (Signed)

## 2020-03-27 NOTE — Discharge Summary (Addendum)
Postpartum Discharge Summary    Patient Name: Gina [facility] DOB: 03-Sep-1985 MRN: [id number]  Date of admission: 03/26/2020 Delivery date:03/27/2020  Delivering provider: Randa Ngo  Date of discharge: 03/28/2020  Admitting diagnosis: Normal labor [O80, Z37.9] Intrauterine pregnancy: [redacted]w[redacted]d    Secondary diagnosis:  Principal Problem:   Vaginal delivery Active Problems:   Sickle cell trait (HTyronza   GUrbandalemultipara   Language barrier  Additional problems: as noted above  Discharge diagnosis: Vaginal delivery                                           Post partum procedures:none Augmentation: none Complications: None  Hospital course: Onset of Labor With Vaginal Delivery      34y.o. yo [id number] admitted in Active Labor on 03/26/2020. Patient had an uncomplicated labor course as follows:  Membrane Rupture Time/Date:  ,   Delivery Method:Vaginal, Spontaneous  Episiotomy: None  Lacerations:  2nd degree  Patient had an uncomplicated postpartum course.  She is ambulating, tolerating a regular diet, passing flatus, and urinating well. Patient is discharged home in stable condition on 03/28/20.  Newborn Data: Birth date:03/27/2020  Birth time:1:42 AM  Gender:Female  Living status:Living  Apgars:8 ,9  Weight:3950 g (8lb 11.3oz)  Magnesium Sulfate received: No BMZ received: No Rhophylac:N/A MMR:N/A T-DaP:Given prenatally Flu: Yes Transfusion:No  Physical exam  Vitals:   03/27/20 1335 03/27/20 1725 03/27/20 2030 03/28/20 0536  BP: 95/64 (!) 94/57 102/60 (!) 83/57  Pulse: 78 81 81   Resp: _0 Temp: 98.7 F (37.1 C) 98.5 F (36.9 C) 98.5 F (36.9 C) 98.3 F (36.8 C)  TempSrc: Oral Oral Oral Oral  SpO2: 100% 100% 100% 100%  Weight:       General: alert and no distress Lochia: appropriate Uterine Fundus: firm Incision: N/A DVT Evaluation: No evidence of DVT seen on physical exam. Labs: Lab Results  Component Value Date   WBC 11.2 (H)  03/26/2020   HGB 12.9 03/26/2020   HCT 39.3 03/26/2020   MCV 75.6 (L) 03/26/2020   PLT 288 03/26/2020   CMP Latest Ref Rng & Units 07/29/2019  Glucose 70 - 99 mg/dL 86  BUN 6 - 20 mg/dL 6  Creatinine 0.44 - 1.00 mg/dL 0.81  Sodium 135 - 145 mmol/L 135  Potassium 3.5 - 5.1 mmol/L 3.5  Chloride 98 - 111 mmol/L 103  CO2 22 - 32 mmol/L 21(L)  Calcium 8.9 - 10.3 mg/dL 9.5   Edinburgh Score: Edinburgh Postnatal Depression Scale Screening Tool 03/27/2020  I have been able to laugh and see the funny side of things. (No Data)    After visit meds:  Allergies as of 03/28/2020   No Known Allergies     Medication List    STOP taking these medications   Comfort Fit Maternity Supp Med Misc   Doxylamine-Pyridoxine 10-10 MG Tbec   ondansetron 4 MG tablet Commonly known as: Zofran   polyethylene glycol 17 g packet Commonly known as: MiraLax Mix-In Pax     TAKE these medications   acetaminophen 325 MG tablet Commonly known as: Tylenol Take 2 tablets (650 mg total) by mouth every 4 (four) hours as needed (for pain scale < 4).   Prenatal 19 tablet Chew 1 tablet by mouth daily.       Discharge home in stable condition Infant  Feeding: breast and bottle Infant Disposition:home with mother Discharge instruction: per After Visit Summary and Postpartum booklet. Activity: Advance as tolerated. Pelvic rest for 6 weeks.  Diet: routine diet Future Appointments: Future Appointments  Date Time Provider [place]  03/31/2020  8:15 AM Chancy Milroy, MD [place] None  04/01/2020  9:40 AM [facility] None   Follow up Visit: Message sent to [facility] on 03/27/20.  Please schedule this patient for a In person postpartum visit in 4 weeks with the following provider: Any provider. Additional Postpartum F/U:none  Low risk pregnancy complicated by: grand multipara, Language barrier (Swahili)  Delivery mode:  Vaginal, Spontaneous  Anticipated Birth Control:   Unsure   03/28/2020 Gina Barter, MD  CNM attestation I have seen and examined this patient and agree with above documentation in the resident's note.   Gina Walton is a 34 y.o. [phone] s/p vag del.   Pain is well controlled.  Plan for birth control is unsure .  Method of Feeding: both  PE:  BP (!) 83/57 (BP Location: Left Arm) Comment: pt sleep  Pulse 81   Temp 98.3 F (36.8 C) (Oral)   Resp 16   Wt 91 kg   LMP 06/21/2019   SpO2 100%   Breastfeeding Unknown   BMI 39.20 kg/m  Fundus firm  No results for input(s): HGB, HCT in the last 72 hours.   Plan: discharge today - postpartum care discussed - f/u clinic in 4 weeks for postpartum visit   Myrtis Ser, CNM 1:19 PM

## 2020-03-28 ENCOUNTER — Encounter (HOSPITAL_COMMUNITY): Payer: Self-pay | Admitting: Obstetrics and Gynecology

## 2020-03-28 MED ORDER — ACETAMINOPHEN 325 MG PO TABS
650.0000 mg | ORAL_TABLET | ORAL | Status: DC | PRN
Start: 2020-03-28 — End: 2021-04-19

## 2020-03-28 NOTE — Social Work (Signed)
CSW verbally consulted to assist MOB with getting a WIC appointment. CSW met with MOB to assist. CSW used interpreter Chantal #[id number]. CSW introduced self to MOB and FOB. CSW observed FOB holding newborn [facility]. CSW provided FOB with information for WIC and called to have an appointment scheduled. CSW confirmed MOB will receive a call from WIC with a Swahili interpreter on Tuesday 12/7 at 3p. MOB and FOB expressed understanding. MOB expressed no additional needs at this time.  CSW identifies no additional needs at this time.  Gina Walton, MSW, LCSWA Clinical Social Work [facility] [phone] 

## 2020-03-31 ENCOUNTER — Encounter: Payer: Medicaid Other | Admitting: Obstetrics and Gynecology

## 2020-04-01 ENCOUNTER — Other Ambulatory Visit (HOSPITAL_COMMUNITY): Payer: Medicaid Other

## 2020-04-03 ENCOUNTER — Inpatient Hospital Stay (HOSPITAL_COMMUNITY): Payer: Medicaid Other

## 2020-04-03 ENCOUNTER — Inpatient Hospital Stay (HOSPITAL_COMMUNITY): Admission: AD | Admit: 2020-04-03 | Payer: Medicaid Other | Source: Home / Self Care | Admitting: Family Medicine

## 2020-04-25 ENCOUNTER — Ambulatory Visit: Payer: Medicaid Other | Admitting: Obstetrics and Gynecology

## 2020-07-11 ENCOUNTER — Encounter: Payer: Self-pay | Admitting: Obstetrics

## 2020-07-11 ENCOUNTER — Ambulatory Visit (INDEPENDENT_AMBULATORY_CARE_PROVIDER_SITE_OTHER): Payer: Medicaid Other | Admitting: Obstetrics

## 2020-07-11 ENCOUNTER — Other Ambulatory Visit: Payer: Self-pay

## 2020-07-11 ENCOUNTER — Other Ambulatory Visit (HOSPITAL_COMMUNITY)
Admission: RE | Admit: 2020-07-11 | Discharge: 2020-07-11 | Disposition: A | Discharge status: Home / Self Care | Payer: Medicaid Other | Source: Ambulatory Visit | Attending: Obstetrics | Admitting: Obstetrics

## 2020-07-11 VITALS — BP 128/89 | HR 104 | Wt 218.0 lb

## 2020-07-11 DIAGNOSIS — N3001 Acute cystitis with hematuria: Secondary | ICD-10-CM

## 2020-07-11 DIAGNOSIS — N898 Other specified noninflammatory disorders of vagina: Secondary | ICD-10-CM | POA: Insufficient documentation

## 2020-07-11 DIAGNOSIS — Z3009 Encounter for other general counseling and advice on contraception: Secondary | ICD-10-CM

## 2020-07-11 DIAGNOSIS — R102 Pelvic and perineal pain: Secondary | ICD-10-CM | POA: Diagnosis not present

## 2020-07-11 DIAGNOSIS — Z01419 Encounter for gynecological examination (general) (routine) without abnormal findings: Secondary | ICD-10-CM

## 2020-07-11 DIAGNOSIS — Z30011 Encounter for initial prescription of contraceptive pills: Secondary | ICD-10-CM

## 2020-07-11 DIAGNOSIS — Z789 Other specified health status: Secondary | ICD-10-CM

## 2020-07-11 DIAGNOSIS — M549 Dorsalgia, unspecified: Secondary | ICD-10-CM

## 2020-07-11 LAB — POCT URINALYSIS DIPSTICK
Bilirubin, UA: NEGATIVE
Glucose, UA: NEGATIVE
Ketones, UA: NEGATIVE
Leukocytes, UA: NEGATIVE
Nitrite, UA: NEGATIVE
Protein, UA: NEGATIVE
Spec Grav, UA: 1.02 (ref 1.010–1.025)
Urobilinogen, UA: 0.2 E.U./dL
pH, UA: 6 (ref 5.0–8.0)

## 2020-07-11 MED ORDER — NORETHINDRONE 0.35 MG PO TABS: 1.0000 | ORAL_TABLET | Freq: Every day | ORAL | 11 refills | Status: DC

## 2020-07-11 MED ORDER — VITAFOL ULTRA 29-0.6-0.4-200 MG PO CAPS: 1.0000 | ORAL_CAPSULE | Freq: Every day | ORAL | 4 refills | Status: DC

## 2020-07-11 MED ORDER — CEFUROXIME AXETIL 500 MG PO TABS: 500.0000 mg | ORAL_TABLET | Freq: Two times a day (BID) | ORAL | 0 refills | Status: DC

## 2020-07-11 NOTE — Progress Notes (Signed)
Subjective:        Gina Walton is a 35 y.o. female here for a routine exam.  Current complaints: Pelvic pain that radiates to back, and vaginal discharge.  Also c/o headaches and is taking Tylenol, with little relief. Would like to discuss birth control options.  Personal health questionnaire:  Is patient Ashkenazi Jewish, have a family history of breast and/or ovarian cancer: no Is there a family history of uterine cancer diagnosed at age < 8, gastrointestinal cancer, urinary tract cancer, family member who is a Personnel officer syndrome-associated carrier: no Is the patient overweight and hypertensive, family history of diabetes, personal history of gestational diabetes, preeclampsia or PCOS: no Is patient over 70, have PCOS,  family history of premature CHD under age 28, diabetes, smoke, have hypertension or peripheral artery disease:  no At any time, has a partner hit, kicked or otherwise hurt or frightened you?: no Over the past 2 weeks, have you felt down, depressed or hopeless?: no Over the past 2 weeks, have you felt little interest or pleasure in doing things?:no   Gynecologic History No LMP recorded. Contraception: oral progesterone-only contraceptive Last Pap: 2020. Results were: NILM with positive HPV Last mammogram: n/a. Results were: n/a  Obstetric History OB History  Gravida Para Term Preterm AB Living  7 7 7     7   SAB IAB Ectopic Multiple Live Births        0 7    # Outcome Date GA Lbr Len/2nd Weight Sex Delivery Anes PTL Lv  7 Term 03/27/20 [redacted]w[redacted]d 03:17 8 lb 11.3 oz (3.95 kg) F Vag-Spont Local  LIV     Birth Comments: none  6 Term 04/02/15    M Vag-Spont   LIV  5 Term 01/29/13    F Vag-Spont   LIV  4 Term 03/30/10    F Vag-Spont   LIV  3 Term 05/13/08    F Vag-Spont   LIV  2 Term 08/11/06    M Vag-Spont   LIV  1 Term 02/08/05    02/10/05    Past Medical History:  Diagnosis Date  . Medical history non-contributory     Past Surgical History:  Procedure  Laterality Date  . NO PAST SURGERIES       Current Outpatient Medications:  .  acetaminophen (TYLENOL) 325 MG tablet, Take 2 tablets (650 mg total) by mouth every 4 (four) hours as needed (for pain scale < 4)., Disp: , Rfl:  .  cefUROXime (CEFTIN) 500 MG tablet, Take 1 tablet (500 mg total) by mouth 2 (two) times daily with a meal., Disp: 14 tablet, Rfl: 0 .  norethindrone (MICRONOR) 0.35 MG tablet, Take 1 tablet (0.35 mg total) by mouth daily., Disp: 28 tablet, Rfl: 11 .  Prenat-Fe Poly-Methfol-FA-DHA (VITAFOL ULTRA) 29-0.6-0.4-200 MG CAPS, Take 1 capsule by mouth daily before breakfast., Disp: 90 capsule, Rfl: 4 No Known Allergies  Social History   Tobacco Use  . Smoking status: Never Smoker  . Smokeless tobacco: Never Used  Substance Use Topics  . Alcohol use: Never    Family History  Problem Relation Age of Onset  . Hypertension Neg Hx   . Diabetes Neg Hx   . Stroke Neg Hx       Review of Systems  Constitutional: negative for fatigue and weight loss Respiratory: negative for cough and wheezing Cardiovascular: negative for chest pain, fatigue and palpitations Gastrointestinal: negative for abdominal pain and change in bowel habits Musculoskeletal:negative  for myalgias Neurological: negative for gait problems and tremors Behavioral/Psych: negative for abusive relationship, depression Endocrine: negative for temperature intolerance    Genitourinary:negative for abnormal menstrual periods, genital lesions, hot flashes, sexual problems and vaginal discharge Integument/breast: negative for breast lump, breast tenderness, nipple discharge and skin lesion(s)    Objective:       BP 128/89   Pulse (!) 104   Wt 218 lb (98.9 kg)   BMI 42.58 kg/m  General:   alert  Skin:   no rash or abnormalities  Lungs:   clear to auscultation bilaterally  Heart:   regular rate and rhythm, S1, S2 normal, no murmur, click, rub or gallop  Breasts:   normal without suspicious masses, skin or  nipple changes or axillary nodes  Abdomen:  normal findings: no organomegaly, soft, non-tender and no hernia  Pelvis:  External genitalia: normal general appearance Urinary system: urethral meatus normal and bladder without fullness, nontender Vaginal: normal without tenderness, induration or masses Cervix: normal appearance Adnexa: normal bimanual exam Uterus: anteverted and non-tender, normal size   Lab Review Urine pregnancy test Labs reviewed yes Radiologic studies reviewed no  50% of 20 min visit spent on counseling and coordination of care.   Assessment:     1. Encounter for gynecological examination with Papanicolaou smear of cervix Rx: - Cytology - PAP( [place])  2. Language barrier - interpreter present  3. Vaginal discharge Rx: - Cervicovaginal ancillary only( [place])  4. Pelvic pain Rx: - US PELVIC COMPLETE WITH TRANSVAGINAL; Future  5. Backache symptom Rx: - POCT urinalysis dipstick - Urine Culture  6. Acute cystitis with hematuria Rx: - cefUROXime (CEFTIN) 500 MG tablet; Take 1 tablet (500 mg total) by mouth 2 (two) times daily with a meal.  Dispense: 14 tablet; Refill: 0  7. Encounter for counseling regarding contraception - wants OCP's  8. Encounter for initial prescription of contraceptive pills Rx: - norethindrone (MICRONOR) 0.35 MG tablet; Take 1 tablet (0.35 mg total) by mouth daily.  Dispense: 28 tablet; Refill: 11  9. Mother currently breast-feeding Rx: - Prenat-Fe Poly-Methfol-FA-DHA (VITAFOL ULTRA) 29-0.6-0.4-200 MG CAPS; Take 1 capsule by mouth daily before breakfast.  Dispense: 90 capsule; Refill: 4    Plan:    Education reviewed: calcium supplements, depression evaluation, low fat, low cholesterol diet, safe sex/STD prevention, self breast exams and weight bearing exercise. Contraception: oral progesterone-only contraceptive. Follow up in: 2 weeks.   Meds ordered this encounter  Medications  . cefUROXime (CEFTIN) 500 MG  tablet    Sig: Take 1 tablet (500 mg total) by mouth 2 (two) times daily with a meal.    Dispense:  14 tablet    Refill:  0  . norethindrone (MICRONOR) 0.35 MG tablet    Sig: Take 1 tablet (0.35 mg total) by mouth daily.    Dispense:  28 tablet    Refill:  11  . Prenat-Fe Poly-Methfol-FA-DHA (VITAFOL ULTRA) 29-0.6-0.4-200 MG CAPS    Sig: Take 1 capsule by mouth daily before breakfast.    Dispense:  90 capsule    Refill:  4   Orders Placed This Encounter  Procedures  . Urine Culture  . US PELVIC COMPLETE WITH TRANSVAGINAL    Standing Status:   Future    Standing Expiration Date:   07/11/2021    Order Specific Question:   Reason for Exam (SYMPTOM  OR DIAGNOSIS REQUIRED)    Answer:   Pelvic pain    Order Specific Question:   Preferred imaging  location?    Answer:   [facility]  . POCT urinalysis dipstick    Brock Bad, MD 07/11/2020 4:41 PM

## 2020-07-11 NOTE — Progress Notes (Signed)
Pt did not have PP exam after delivery, delivered in December 2021.   Pt has been having lower pelvic pain that radiates to her back x 1 month.  Denies urinary complaints.   Pt also states she is having HA's, using Tylenol.   States this has been since delivery and has had little relief with Tylenol.  Pt denies BP problems during pregnancy.   Pt would like to discuss BC options.   Pt is also due for pap, was recommended to repeat after 2020 results.

## 2020-07-12 LAB — CERVICOVAGINAL ANCILLARY ONLY
Candida Glabrata: NEGATIVE
Candida Vaginitis: POSITIVE — AB
Chlamydia: NEGATIVE
Comment: NEGATIVE
Comment: NEGATIVE
Comment: NEGATIVE
Comment: NEGATIVE
Comment: NORMAL
Neisseria Gonorrhea: NEGATIVE
Trichomonas: NEGATIVE

## 2020-07-12 LAB — CYTOLOGY - PAP
Comment: NEGATIVE
Diagnosis: NEGATIVE
High risk HPV: NEGATIVE

## 2020-07-13 ENCOUNTER — Other Ambulatory Visit: Payer: Self-pay | Admitting: Obstetrics

## 2020-07-13 DIAGNOSIS — B3731 Acute candidiasis of vulva and vagina: Secondary | ICD-10-CM

## 2020-07-13 DIAGNOSIS — B373 Candidiasis of vulva and vagina: Secondary | ICD-10-CM

## 2020-07-13 LAB — URINE CULTURE

## 2020-07-13 MED ORDER — FLUCONAZOLE 150 MG PO TABS: 150.0000 mg | ORAL_TABLET | Freq: Once | ORAL | 0 refills | Status: AC

## 2020-07-13 MED FILL — FLUCONAZOLE 150 MG TABLET: 150 | 1 days supply | Qty: 1 | Fill #0

## 2020-07-19 ENCOUNTER — Ambulatory Visit: Admission: RE | Admit: 2020-07-19 | Payer: Medicaid Other | Source: Ambulatory Visit

## 2020-07-25 ENCOUNTER — Ambulatory Visit: Payer: Medicaid Other | Admitting: Obstetrics

## 2020-12-19 ENCOUNTER — Emergency Department (HOSPITAL_COMMUNITY)
Admission: EM | Admit: 2020-12-19 | Discharge: 2020-12-19 | Disposition: A | Discharge status: Home / Self Care | Payer: Medicaid Other | Attending: Emergency Medicine | Admitting: Emergency Medicine

## 2020-12-19 ENCOUNTER — Other Ambulatory Visit: Payer: Self-pay

## 2020-12-19 ENCOUNTER — Encounter (HOSPITAL_COMMUNITY): Payer: Self-pay | Admitting: Emergency Medicine

## 2020-12-19 DIAGNOSIS — R509 Fever, unspecified: Secondary | ICD-10-CM | POA: Insufficient documentation

## 2020-12-19 DIAGNOSIS — U071 COVID-19: Secondary | ICD-10-CM | POA: Insufficient documentation

## 2020-12-19 DIAGNOSIS — Z3A00 Weeks of gestation of pregnancy not specified: Secondary | ICD-10-CM | POA: Diagnosis not present

## 2020-12-19 DIAGNOSIS — O98511 Other viral diseases complicating pregnancy, first trimester: Secondary | ICD-10-CM | POA: Insufficient documentation

## 2020-12-19 DIAGNOSIS — Z3490 Encounter for supervision of normal pregnancy, unspecified, unspecified trimester: Secondary | ICD-10-CM

## 2020-12-19 DIAGNOSIS — Z3A01 Less than 8 weeks gestation of pregnancy: Secondary | ICD-10-CM | POA: Diagnosis not present

## 2020-12-19 LAB — COMPREHENSIVE METABOLIC PANEL
ALT: 22 U/L (ref 0–44)
AST: 21 U/L (ref 15–41)
Albumin: 3.5 g/dL (ref 3.5–5.0)
Alkaline Phosphatase: 40 U/L (ref 38–126)
Anion gap: 8 (ref 5–15)
BUN: 8 mg/dL (ref 6–20)
CO2: 20 mmol/L — ABNORMAL LOW (ref 22–32)
Calcium: 9.2 mg/dL (ref 8.9–10.3)
Chloride: 106 mmol/L (ref 98–111)
Creatinine, Ser: 0.8 mg/dL (ref 0.44–1.00)
GFR, Estimated: >60 mL/min (ref >60)
Glucose, Bld: 98 mg/dL (ref 70–99)
Potassium: 3.6 mmol/L (ref 3.5–5.1)
Sodium: 134 mmol/L — ABNORMAL LOW (ref 135–145)
Total Bilirubin: 0.5 mg/dL (ref 0.3–1.2)
Total Protein: 7.5 g/dL (ref 6.5–8.1)

## 2020-12-19 LAB — PREGNANCY, URINE: Preg Test, Ur: POSITIVE — AB

## 2020-12-19 LAB — URINALYSIS, ROUTINE W REFLEX MICROSCOPIC
Bilirubin Urine: NEGATIVE
Glucose, UA: NEGATIVE mg/dL
Ketones, ur: 5 mg/dL — AB
Nitrite: NEGATIVE
Protein, ur: NEGATIVE mg/dL
Specific Gravity, Urine: 1.015 (ref 1.005–1.030)
pH: 5 (ref 5.0–8.0)

## 2020-12-19 LAB — CBC
HCT: 37.9 % (ref 36.0–46.0)
Hemoglobin: 12.6 g/dL (ref 12.0–15.0)
MCH: 25.4 pg — ABNORMAL LOW (ref 26.0–34.0)
MCHC: 33.2 g/dL (ref 30.0–36.0)
MCV: 76.4 fL — ABNORMAL LOW (ref 80.0–100.0)
Platelets: 248 10*3/uL (ref 150–400)
RBC: 4.96 MIL/uL (ref 3.87–5.11)
RDW: 13.8 % (ref 11.5–15.5)
WBC: 4.6 10*3/uL (ref 4.0–10.5)
nRBC: 0 % (ref 0.0–0.2)

## 2020-12-19 LAB — LIPASE, BLOOD: Lipase: 26 U/L (ref 11–51)

## 2020-12-19 LAB — RESP PANEL BY RT-PCR (FLU A&B, COVID) ARPGX2
Influenza A by PCR: NEGATIVE
Influenza B by PCR: NEGATIVE
SARS Coronavirus 2 by RT PCR: POSITIVE — AB

## 2020-12-19 MED ORDER — CEPHALEXIN 500 MG PO CAPS: 500.0000 mg | ORAL_CAPSULE | Freq: Two times a day (BID) | ORAL | 0 refills | Status: DC

## 2020-12-19 MED ORDER — ACETAMINOPHEN 325 MG PO TABS
650.0000 mg | ORAL_TABLET | Freq: Once | ORAL | Status: AC
  Administered 2020-12-19: 650 mg via ORAL
  Filled 2020-12-19: qty 2

## 2020-12-19 NOTE — ED Provider Notes (Signed)
[facility] EMERGENCY DEPARTMENT Provider Note   CSN: [id number] Arrival date & time: 12/19/20  0534     History Chief Complaint  Patient presents with   Back Pain   Headache    Gina Walton is a 35 y.o. female.  35 year old female with prior medical history as detailed below presents for evaluation.  Patient speaks Swahili.  Translation used for history.  Patient complains of mild headache and diffuse body aches that started yesterday.  She took some Tylenol yesterday with some improvement in her symptoms.  She did not notice a fever at home.  Tylenol given this morning while in the ED [facility] has improved her symptoms somewhat as well.  She denies sick contacts.  She denies nausea or vomiting.  She denies visual change or other neurologic problem.  She denies cough or congestion.  She denies sore throat.  She denies urinary symptoms.  She denies any travel abroad in the last 3 months.  The history is provided by the patient. A language interpreter was used.  Illness Location:  Body aches, headache Severity:  Mild Onset quality:  Gradual Duration:  1 day Timing:  Rare Progression:  Waxing and waning Chronicity:  New     Past Medical History:  Diagnosis Date   Medical history non-contributory     Patient Active Problem List   Diagnosis Date Noted   Vaginal delivery 03/26/2020   Language barrier 03/16/2020   Grand multipara 12/08/2019   Sickle cell trait (HCC) 10/13/2019   Supervision of other normal pregnancy, antepartum 09/15/2019    Past Surgical History:  Procedure Laterality Date   NO PAST SURGERIES       OB History     Gravida  7   Para  7   Term  7   Preterm      AB      Living  7      SAB      IAB      Ectopic      Multiple  0   Live Births  7           Family History  Problem Relation Age of Onset   Hypertension Neg Hx    Diabetes Neg Hx    Stroke Neg Hx     Social History   Tobacco Use   Smoking  status: Never   Smokeless tobacco: Never  Vaping Use   Vaping Use: Never used  Substance Use Topics   Alcohol use: Never   Drug use: Never    Home Medications Prior to Admission medications   Medication Sig Start Date End Date Taking? Authorizing Provider  acetaminophen (TYLENOL) 325 MG tablet Take 2 tablets (650 mg total) by mouth every 4 (four) hours as needed (for pain scale < 4). 03/28/20   Trula Slade, MD  cefUROXime (CEFTIN) 500 MG tablet Take 1 tablet (500 mg total) by mouth 2 (two) times daily with a meal. 07/11/20   Brock Bad, MD  norethindrone (MICRONOR) 0.35 MG tablet Take 1 tablet (0.35 mg total) by mouth daily. 07/11/20   Brock Bad, MD  Prenat-Fe Poly-Methfol-FA-DHA (VITAFOL ULTRA) 29-0.6-0.4-200 MG CAPS Take 1 capsule by mouth daily before breakfast. 07/11/20   Brock Bad, MD    Allergies    Patient has no known allergies.  Review of Systems   Review of Systems  All other systems reviewed and are negative.  Physical Exam Updated Vital Signs BP 101/75  Pulse 94   Temp (!) 102.4 F (39.1 C) (Oral)   Resp 16   SpO2 98%   Physical Exam Vitals and nursing note reviewed.  Constitutional:      General: She is not in acute distress.    Appearance: Normal appearance. She is well-developed.  HENT:     Head: Normocephalic and atraumatic.  Eyes:     General: No visual field deficit.    Conjunctiva/sclera: Conjunctivae normal.     Pupils: Pupils are equal, round, and reactive to light.  Cardiovascular:     Rate and Rhythm: Normal rate and regular rhythm.     Heart sounds: Normal heart sounds.  Pulmonary:     Effort: Pulmonary effort is normal. No respiratory distress.     Breath sounds: Normal breath sounds.  Abdominal:     General: There is no distension.     Palpations: Abdomen is soft.     Tenderness: There is no abdominal tenderness.  Musculoskeletal:        General: No deformity. Normal range of motion.     Cervical back:  Normal range of motion and neck supple.  Skin:    General: Skin is warm and dry.  Neurological:     General: No focal deficit present.     Mental Status: She is alert and oriented to person, place, and time.     GCS: GCS eye subscore is 4. GCS verbal subscore is 5. GCS motor subscore is 6.     Cranial Nerves: No cranial nerve deficit, dysarthria or facial asymmetry.     Sensory: No sensory deficit.     Motor: No weakness.    ED Results / Procedures / Treatments   Labs (all labs ordered are listed, but only abnormal results are displayed) Labs Reviewed  COMPREHENSIVE METABOLIC PANEL - Abnormal; Notable for the following components:      Result Value   Sodium 134 (*)    CO2 20 (*)    All other components within normal limits  CBC - Abnormal; Notable for the following components:   MCV 76.4 (*)    MCH 25.4 (*)    All other components within normal limits  RESP PANEL BY RT-PCR (FLU A&B, COVID) ARPGX2  LIPASE, BLOOD  PREGNANCY, URINE  URINALYSIS, ROUTINE W REFLEX MICROSCOPIC    EKG None  Radiology No results found.  Procedures Procedures   Medications Ordered in ED Medications  acetaminophen (TYLENOL) tablet 650 mg (650 mg Oral Given 12/19/20 0735)    ED Course  I have reviewed the triage vital signs and the nursing notes.  Pertinent labs & imaging results that were available during my care of the patient were reviewed by me and considered in my medical decision making (see chart for details).    MDM Rules/Calculators/A&P                           MDM  MSE complete  Kanchan Gal was evaluated in Emergency Department on 12/19/2020 for the symptoms described in the history of present illness. She was evaluated in the context of the global COVID-19 pandemic, which necessitated consideration that the patient might be at risk for infection with the SARS-CoV-2 virus that causes COVID-19. Institutional protocols and algorithms that pertain to the evaluation of patients  at risk for COVID-19 are in a state of rapid change based on information released by regulatory bodies including the CDC and federal and state organizations. These policies  and algorithms were followed during the patient's care in the ED.   Patient presented initially with body aches and mild headache with associated fever.  Symptoms began yesterday. Presentation most consistent with likely viral syndrome.   Work-up included a urine pregnancy test.  This returned as a positive.  Additional questioning to the patient reveals that she currently has an 63-month-old child at home (7 total children at home) and she is surprised that she could become pregnant while nursing and she has not yet had a period since her delivery.  Case briefly discussed with Dr. Adrian Blackwater of the MAU.  He feels that transfer to the MAU at this time is not warranted.  He recommends completion of work-up in the ED.   Pending Covid screen results and dispo signed out to Dr. Jodi Mourning.  Final Clinical Impression(s) / ED Diagnoses Final diagnoses:  Fever, unspecified fever cause  Pregnancy, unspecified gestational age  COVID-19    Rx / DC Orders ED Discharge Orders     None        Wynetta Fines, MD 12/20/20 1005

## 2020-12-19 NOTE — Discharge Instructions (Addendum)
You have COVID, good handwashing and minimize exposure with others.  Wear mask regularly. You are pregnant, follow-up with local OB doctor. Take oral antibiotics for possible urine infection. Return for new or worsening signs or symptoms.

## 2020-12-19 NOTE — ED Triage Notes (Signed)
Patient coming from home, complaint of headache and back pain that began yesterday.

## 2020-12-19 NOTE — ED Provider Notes (Signed)
Patient care signed out to follow-up COVID test result.  COVID test result returned positive.  Used interpreter to update on new pregnancy, COVID positive and possible UTI.  Discussed importance of taking antibiotics and following up closely with OB and primary doctor.  Patient comfortable this plan.  Kenton Kingfisher, MD 12/19/20 [phone]

## 2020-12-20 ENCOUNTER — Telehealth: Payer: Self-pay | Admitting: *Deleted

## 2020-12-20 LAB — URINE CULTURE: Culture: NO GROWTH

## 2020-12-20 NOTE — Telephone Encounter (Signed)
Transition Care Management Unsuccessful Follow-up Telephone Call  Date of discharge and from where:  12/19/2020 [facility]  Attempts:  1st Attempt  Reason for unsuccessful TCM follow-up call:  Left voice message via Swahili interpreter

## 2020-12-21 NOTE — Telephone Encounter (Signed)
Transition Care Management Follow-up Telephone Call Date of discharge and from where: 12/19/2020 - [facility] How have you been since you were released from the hospital? "I am doing fine" Any questions or concerns? No  Items Reviewed: Did the pt receive and understand the discharge instructions provided? Yes  Medications obtained and verified?  N/A Other? No  Any new allergies since your discharge? No  Dietary orders reviewed? No Do you have support at home? Yes    Functional Questionnaire: (I = Independent and D = Dependent) ADLs: I  Bathing/Dressing- I  Meal Prep- I  Eating- I  Maintaining continence- I  Transferring/Ambulation- I  Managing Meds- I  Follow up appointments reviewed:  PCP Hospital f/u appt confirmed? No   Specialist Hospital f/u appt confirmed? No   Are transportation arrangements needed? No  If their condition worsens, is the pt aware to call PCP or go to the Emergency Dept.? Yes Was the patient provided with contact information for the PCP's office or ED? Yes Was to pt encouraged to call back with questions or concerns? Yes

## 2020-12-23 ENCOUNTER — Ambulatory Visit (HOSPITAL_COMMUNITY)
Admission: EM | Admit: 2020-12-23 | Discharge: 2020-12-23 | Discharge status: Against Medical Advice | Payer: Medicaid Other

## 2021-03-14 ENCOUNTER — Ambulatory Visit: Payer: Self-pay

## 2021-03-14 ENCOUNTER — Ambulatory Visit (INDEPENDENT_AMBULATORY_CARE_PROVIDER_SITE_OTHER): Payer: Medicaid Other

## 2021-03-14 ENCOUNTER — Other Ambulatory Visit: Payer: Self-pay

## 2021-03-14 VITALS — BP 110/78 | HR 101 | Wt 230.6 lb

## 2021-03-14 DIAGNOSIS — O0932 Supervision of pregnancy with insufficient antenatal care, second trimester: Secondary | ICD-10-CM

## 2021-03-14 DIAGNOSIS — Z3A21 21 weeks gestation of pregnancy: Secondary | ICD-10-CM | POA: Diagnosis not present

## 2021-03-14 DIAGNOSIS — Z3490 Encounter for supervision of normal pregnancy, unspecified, unspecified trimester: Secondary | ICD-10-CM | POA: Insufficient documentation

## 2021-03-14 MED ORDER — BLOOD PRESSURE KIT DEVI
1.0000 | 0 refills | Status: DC
Start: 2021-03-14 — End: 2023-03-07

## 2021-03-14 MED ORDER — VITAFOL ULTRA 29-0.6-0.4-200 MG PO CAPS: 1.0000 | ORAL_CAPSULE | Freq: Every day | ORAL | 11 refills | Status: DC

## 2021-03-14 NOTE — Addendum Note (Signed)
Addended by: Hamilton Capri on: 03/14/2021 10:41 AM   Modules accepted: Orders

## 2021-03-14 NOTE — Progress Notes (Signed)
[place] New OB Intake  I connected with  Gina Walton on 03/14/21 at  9:15 AM EST by in person and verified that I am speaking with the correct person using two identifiers. Nurse is located at [facility] and pt is located at [place].  I discussed the limitations, risks, security and privacy concerns of performing an evaluation and management service by telephone and the availability of in person appointments. I also discussed with the patient that there may be a patient responsible charge related to this service. The patient expressed understanding and agreed to proceed.  I explained I am completing New OB Intake today. Patient states that her cycle never returned from her last baby delivered 03/2020. She states that she had 3 days of spotting, but not enough to wear a pad. She was unsure if bleeding was her cycle or not due to breastfeeding. Patient reports feeling some movement at this time.We discussed her EDD of 07/20/20 that is based on LMP of second trimester dating scan.. Pt is G8/P7007. I reviewed her allergies, medications, Medical/Surgical/OB history, and appropriate screenings. I informed her of [facility] services. Based on history, this is a/an  pregnancy uncomplicated .   Patient Active Problem List   Diagnosis Date Noted   Vaginal delivery 03/26/2020   Language barrier 03/16/2020   Grand multipara 12/08/2019   Sickle cell trait (HCC) 10/13/2019   Supervision of other normal pregnancy, antepartum 09/15/2019    Concerns addressed today  Delivery Plans:  Plans to deliver at [facility].   MyChart/Babyscripts MyChart access verified. I explained pt will have some visits in office and some virtually. Babyscripts instructions given and order placed. Patient verifies receipt of registration text/e-mail. Account successfully created and app downloaded.  Blood Pressure Cuff  Blood pressure cuff sent to [facility]. Explained after first prenatal appt pt will check weekly and document in  Babyscripts.  Weight scale: Patient    have weight scale. Weight scale ordered for patient to pick up form [facility].   Anatomy US Explained first scheduled [place] will be around 19 weeks. Dating US performed today.  Labs Discussed Avelina Laine genetic screening with patient. Would like both Panorama and Horizon drawn at new OB visit. Routine prenatal labs needed.  Covid Vaccine Patient has not covid vaccine.    Informed patient of [facility] website  and placed link in her AVS.   Social Determinants of Health Food Insecurity: Patient denies food insecurity. WIC Referral: Patient is interested in referral to [facility].  Transportation: Patient denies transportation needs. Childcare: Discussed no children allowed at ultrasound appointments. Offered childcare services; patient declines childcare services at this time.  Send link to Pregnancy Navigators   Placed OB Box on problem list and updated  First visit review I reviewed new OB appt with pt. I explained she will have a pelvic exam, ob bloodwork with genetic screening, and PAP smear. Explained pt will be seen by Coral Ceo at first visit; encounter routed to appropriate provider. Explained that patient will be seen by pregnancy navigator following visit with provider. [facility] information placed in AVS.   Hamilton Capri, RN 03/14/2021  9:21 AM

## 2021-03-22 ENCOUNTER — Encounter: Payer: Self-pay | Admitting: Obstetrics

## 2021-03-22 ENCOUNTER — Other Ambulatory Visit: Payer: Self-pay

## 2021-03-22 ENCOUNTER — Other Ambulatory Visit (HOSPITAL_COMMUNITY)
Admission: RE | Admit: 2021-03-22 | Discharge: 2021-03-22 | Disposition: A | Discharge status: Home / Self Care | Payer: Medicaid Other | Source: Ambulatory Visit | Attending: Obstetrics | Admitting: Obstetrics

## 2021-03-22 ENCOUNTER — Ambulatory Visit (INDEPENDENT_AMBULATORY_CARE_PROVIDER_SITE_OTHER): Payer: Medicaid Other | Admitting: Obstetrics

## 2021-03-22 VITALS — BP 98/56 | HR 60 | Wt 229.6 lb

## 2021-03-22 DIAGNOSIS — Z3482 Encounter for supervision of other normal pregnancy, second trimester: Secondary | ICD-10-CM

## 2021-03-22 DIAGNOSIS — Z789 Other specified health status: Secondary | ICD-10-CM | POA: Diagnosis not present

## 2021-03-22 DIAGNOSIS — Z23 Encounter for immunization: Secondary | ICD-10-CM | POA: Diagnosis not present

## 2021-03-22 DIAGNOSIS — O99212 Obesity complicating pregnancy, second trimester: Secondary | ICD-10-CM | POA: Diagnosis not present

## 2021-03-22 DIAGNOSIS — Z3A22 22 weeks gestation of pregnancy: Secondary | ICD-10-CM | POA: Diagnosis not present

## 2021-03-22 DIAGNOSIS — Z31430 Encounter of female for testing for genetic disease carrier status for procreative management: Secondary | ICD-10-CM | POA: Diagnosis not present

## 2021-03-22 DIAGNOSIS — Z603 Acculturation difficulty: Secondary | ICD-10-CM

## 2021-03-22 DIAGNOSIS — O99210 Obesity complicating pregnancy, unspecified trimester: Secondary | ICD-10-CM

## 2021-03-22 NOTE — Progress Notes (Signed)
Subjective:    Gina Walton is being seen today for her first obstetrical visit.  This is not a planned pregnancy. She is at [redacted]w[redacted]d gestation. Her obstetrical history is significant for  grand multiparity . Relationship with FOB: significant other, not living together.but supportive. ` Patient does intend to breast feed. Pregnancy history fully reviewed.  The information documented in the HPI was reviewed and verified.  Menstrual History: OB History     Gravida  8   Para  7   Term  7   Preterm      AB      Living  7      SAB      IAB      Ectopic      Multiple  0   Live Births  7            Patient's last menstrual period was 10/05/2020.    Past Medical History:  Diagnosis Date   Medical history non-contributory     Past Surgical History:  Procedure Laterality Date   NO PAST SURGERIES      (Not in a hospital admission)  No Known Allergies  Social History   Tobacco Use   Smoking status: Never   Smokeless tobacco: Never  Substance Use Topics   Alcohol use: Never    Family History  Problem Relation Age of Onset   Hypertension Neg Hx    Diabetes Neg Hx    Stroke Neg Hx      Review of Systems Constitutional: negative for weight loss Gastrointestinal: negative for vomiting Genitourinary:negative for genital lesions and vaginal discharge and dysuria Musculoskeletal:negative for back pain Behavioral/Psych: negative for abusive relationship, depression, illegal drug usage and tobacco use    Objective:    BP (!) 98/56   Pulse 60   Wt 229 lb 9.6 oz (104.1 kg)   LMP 10/05/2020 Comment: Only spotting for 3 days. Patient was breastfeeding at the time  BMI 44.84 kg/m  General Appearance:    Alert, cooperative, no distress, appears stated age  Head:    Normocephalic, without obvious abnormality, atraumatic  Eyes:    PERRL, conjunctiva/corneas clear, EOM's intact, fundi    benign, both eyes  Ears:    Normal TM's and external ear canals, both ears   Nose:   Nares normal, septum midline, mucosa normal, no drainage    or sinus tenderness  Throat:   Lips, mucosa, and tongue normal; teeth and gums normal  Neck:   Supple, symmetrical, trachea midline, no adenopathy;    thyroid:  no enlargement/tenderness/nodules; no carotid   bruit or JVD  Back:     Symmetric, no curvature, ROM normal, no CVA tenderness  Lungs:     Clear to auscultation bilaterally, respirations unlabored  Chest Wall:    No tenderness or deformity   Heart:    Regular rate and rhythm, S1 and S2 normal, no murmur, rub   or gallop  Breast Exam:    No tenderness, masses, or nipple abnormality  Abdomen:     Soft, non-tender, bowel sounds active all four quadrants,    no masses, no organomegaly  Genitalia:    Normal female without lesion, discharge or tenderness  Extremities:   Extremities normal, atraumatic, no cyanosis or edema  Pulses:   2+ and symmetric all extremities  Skin:   Skin color, texture, turgor normal, no rashes or lesions  Lymph nodes:   Cervical, supraclavicular, and axillary nodes normal  Neurologic:   CNII-XII intact,  normal strength, sensation and reflexes    throughout      Lab Review Urine pregnancy test Labs reviewed yes Radiologic studies reviewed yes   Assessment:    Pregnancy at [redacted]w[redacted]d weeks    Plan:    1. Encounter for supervision of other normal pregnancy in second trimester Rx: - CBC/D/Plt+RPR+Rh+ABO+RubIgG... - Culture, OB Urine - Cervicovaginal ancillary only - Genetic Screening - AFP, Serum, Open Spina Bifida - Flu Vaccine QUAD 2mo+IM (Fluarix, Fluzone & Alfiuria Quad PF) - [place] MFM OB COMP + 14 WK; Future  2. Language barrier - interpreter present    Prenatal vitamins.  Counseling provided regarding continued use of seat belts, cessation of alcohol consumption, smoking or use of illicit drugs; infection precautions i.e., influenza/TDAP immunizations, toxoplasmosis,CMV, parvovirus, listeria and varicella; workplace safety,  exercise during pregnancy; routine dental care, safe medications, sexual activity, hot tubs, saunas, pools, travel, caffeine use, fish and methlymercury, potential toxins, hair treatments, varicose veins Weight gain recommendations per IOM guidelines reviewed: underweight/BMI< 18.5--> gain 28 - 40 lbs; normal weight/BMI 18.5 - 24.9--> gain 25 - 35 lbs; overweight/BMI 25 - 29.9--> gain 15 - 25 lbs; obese/BMI >30->gain  11 - 20 lbs Problem list reviewed and updated. FIRST/CF mutation testing/NIPT/QUAD SCREEN/fragile X/Ashkenazi Jewish population testing/Spinal muscular atrophy discussed: requested. Role of ultrasound in pregnancy discussed; fetal survey: requested. Amniocentesis discussed: not indicated.   Orders Placed This Encounter  Procedures   Culture, OB Urine   [place] MFM OB COMP + 14 WK    Standing Status:   Future    Standing Expiration Date:   03/22/2022    Order Specific Question:   Reason for Exam (SYMPTOM  OR DIAGNOSIS REQUIRED)    Answer:   anatomy    Order Specific Question:   Preferred Location    Answer:   [facility] Ultrasound   Flu Vaccine QUAD 92mo+IM (Fluarix, Fluzone & Alfiuria Quad PF)   CBC/D/Plt+RPR+Rh+ABO+RubIgG...   Genetic Screening    PANORAMA   AFP, Serum, Open Spina Bifida    Order Specific Question:   Is patient insulin dependent?    Answer:   No    Order Specific Question:   Patient weight (lb.)    Answer:   229 lb 9.6 oz (104.1 kg)    Order Specific Question:   Gestational Age (GA), weeks    Answer:   22.6    Order Specific Question:   Date on which patient was at this GA    Answer:   03/22/2021    Order Specific Question:   GA Calculation Method    Answer:   Ultrasound    Order Specific Question:   Number of fetuses    Answer:   0    Order Specific Question:   Reason for screen    Answer:   OTHER    Comments:   routine     Order Specific Question:   Donor egg?    Answer:   N    Order Specific Question:   Age of egg donor?    Answer:   0    Follow  up in 4 weeks.  I have spent a total of 20 minutes of face-to-face time, excluding clinical staff time, reviewing notes and preparing to see patient, ordering tests and/or medications, and counseling the patient.   Brock Bad, MD 03/22/2021 2:21 PM

## 2021-03-22 NOTE — Progress Notes (Signed)
Pt presents for NOB visit. NOB intake and u/s completed 03/14/21 Normal pap 07/11/20 Flu vaccine given RD without difficulty

## 2021-03-24 ENCOUNTER — Other Ambulatory Visit: Payer: Self-pay | Admitting: Obstetrics

## 2021-03-24 DIAGNOSIS — N760 Acute vaginitis: Secondary | ICD-10-CM

## 2021-03-24 DIAGNOSIS — B3731 Acute candidiasis of vulva and vagina: Secondary | ICD-10-CM

## 2021-03-24 DIAGNOSIS — B9689 Other specified bacterial agents as the cause of diseases classified elsewhere: Secondary | ICD-10-CM

## 2021-03-24 LAB — AFP, SERUM, OPEN SPINA BIFIDA
AFP MoM: 1.34
AFP Value: 95.1 ng/mL
Gest. Age on Collection Date: 22.6 weeks
Maternal Age At EDD: 33.6 yr
OSBR Risk 1 IN: 8546
Test Results:: NEGATIVE
Weight: 229 [lb_av]

## 2021-03-24 LAB — CERVICOVAGINAL ANCILLARY ONLY
Bacterial Vaginitis (gardnerella): POSITIVE — AB
Candida Glabrata: NEGATIVE
Candida Vaginitis: POSITIVE — AB
Chlamydia: NEGATIVE
Comment: NEGATIVE
Comment: NEGATIVE
Comment: NEGATIVE
Comment: NEGATIVE
Comment: NEGATIVE
Comment: NORMAL
Neisseria Gonorrhea: NEGATIVE
Trichomonas: NEGATIVE

## 2021-03-24 LAB — CBC/D/PLT+RPR+RH+ABO+RUBIGG...
Antibody Screen: NEGATIVE
Basophils Absolute: 0 10*3/uL (ref 0.0–0.2)
Basos: 0 %
EOS (ABSOLUTE): 0.1 10*3/uL (ref 0.0–0.4)
Eos: 1 %
HCV Ab: <0.1 s/co ratio (ref 0.0–0.9)
HIV Screen 4th Generation wRfx: NONREACTIVE
Hematocrit: 35.4 % (ref 34.0–46.6)
Hemoglobin: 11.9 g/dL (ref 11.1–15.9)
Hepatitis B Surface Ag: NEGATIVE
Immature Grans (Abs): 0.1 10*3/uL (ref 0.0–0.1)
Immature Granulocytes: 1 %
Lymphocytes Absolute: 1.6 10*3/uL (ref 0.7–3.1)
Lymphs: 19 %
MCH: 25.5 pg — ABNORMAL LOW (ref 26.6–33.0)
MCHC: 33.6 g/dL (ref 31.5–35.7)
MCV: 76 fL — ABNORMAL LOW (ref 79–97)
Monocytes Absolute: 0.6 10*3/uL (ref 0.1–0.9)
Monocytes: 7 %
Neutrophils Absolute: 6.4 10*3/uL (ref 1.4–7.0)
Neutrophils: 72 %
Platelets: 265 10*3/uL (ref 150–450)
RBC: 4.66 x10E6/uL (ref 3.77–5.28)
RDW: 14.2 % (ref 11.7–15.4)
RPR Ser Ql: NONREACTIVE
Rh Factor: POSITIVE
Rubella Antibodies, IGG: 12.7 index (ref >0.99)
WBC: 8.8 10*3/uL (ref 3.4–10.8)

## 2021-03-24 LAB — CULTURE, OB URINE

## 2021-03-24 LAB — URINE CULTURE, OB REFLEX

## 2021-03-24 LAB — HCV INTERPRETATION

## 2021-03-24 MED ORDER — TERCONAZOLE 0.4 % VA CREA: 1.0000 | TOPICAL_CREAM | Freq: Every day | VAGINAL | 0 refills | Status: DC

## 2021-03-24 MED ORDER — METRONIDAZOLE 500 MG PO TABS: 500.0000 mg | ORAL_TABLET | Freq: Two times a day (BID) | ORAL | 2 refills | Status: DC

## 2021-03-28 ENCOUNTER — Encounter: Payer: Self-pay | Admitting: Obstetrics

## 2021-03-29 ENCOUNTER — Other Ambulatory Visit: Payer: Self-pay | Admitting: Obstetrics

## 2021-03-29 DIAGNOSIS — B3731 Acute candidiasis of vulva and vagina: Secondary | ICD-10-CM

## 2021-03-29 MED ORDER — TERCONAZOLE 0.4 % VA CREA: 1.0000 | TOPICAL_CREAM | Freq: Every day | VAGINAL | 0 refills | Status: DC

## 2021-04-04 ENCOUNTER — Other Ambulatory Visit: Payer: Self-pay

## 2021-04-04 ENCOUNTER — Ambulatory Visit: Payer: Medicaid Other | Admitting: *Deleted

## 2021-04-04 ENCOUNTER — Encounter: Payer: Self-pay | Admitting: Obstetrics

## 2021-04-04 ENCOUNTER — Encounter: Payer: Self-pay | Admitting: *Deleted

## 2021-04-04 ENCOUNTER — Ambulatory Visit: Payer: Medicaid Other | Attending: Obstetrics & Gynecology

## 2021-04-04 DIAGNOSIS — O99212 Obesity complicating pregnancy, second trimester: Secondary | ICD-10-CM | POA: Diagnosis not present

## 2021-04-04 DIAGNOSIS — E669 Obesity, unspecified: Secondary | ICD-10-CM

## 2021-04-04 DIAGNOSIS — Z3A24 24 weeks gestation of pregnancy: Secondary | ICD-10-CM

## 2021-04-04 DIAGNOSIS — Z3482 Encounter for supervision of other normal pregnancy, second trimester: Secondary | ICD-10-CM

## 2021-04-04 DIAGNOSIS — O0942 Supervision of pregnancy with grand multiparity, second trimester: Secondary | ICD-10-CM | POA: Diagnosis not present

## 2021-04-04 DIAGNOSIS — Z3490 Encounter for supervision of normal pregnancy, unspecified, unspecified trimester: Secondary | ICD-10-CM | POA: Insufficient documentation

## 2021-04-05 ENCOUNTER — Other Ambulatory Visit: Payer: Self-pay | Admitting: *Deleted

## 2021-04-05 DIAGNOSIS — O99212 Obesity complicating pregnancy, second trimester: Secondary | ICD-10-CM

## 2021-04-07 IMAGING — US US MFM OB FOLLOW-UP
1 series · 13 of 28 positions shown · non-contrast
Comparison: none

[Series 1: us mfm ob follow-up · 51 acquisitions, 13 frames shown]
[im 2/51]
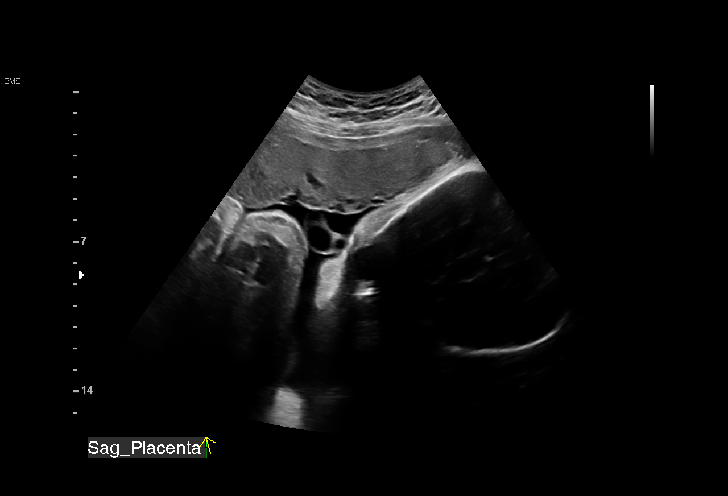
[im 6/51]
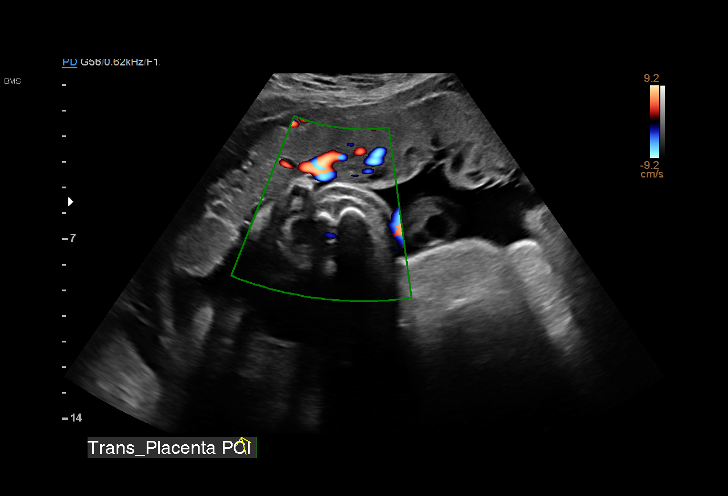
[im 10/51]
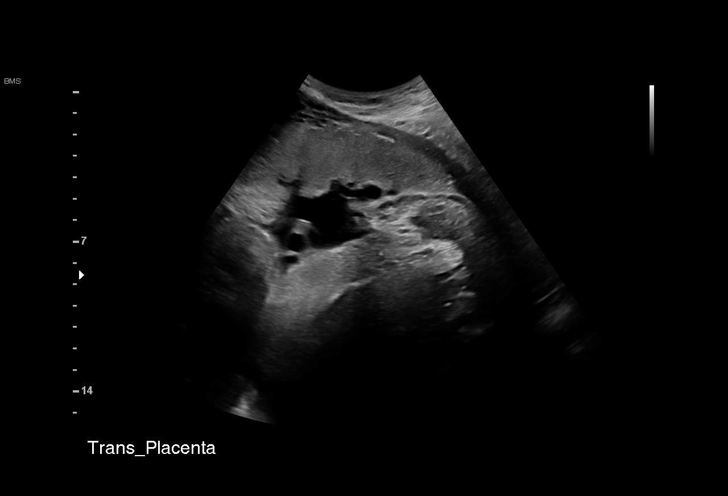
[im 13/51]
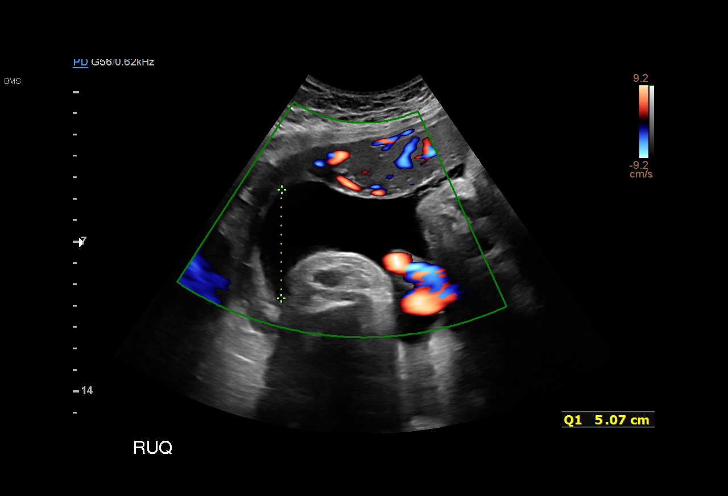
[im 17/51]
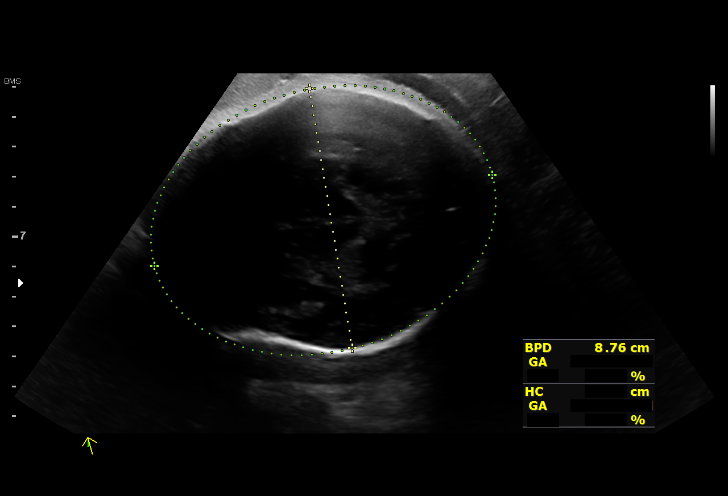
[im 21/51]
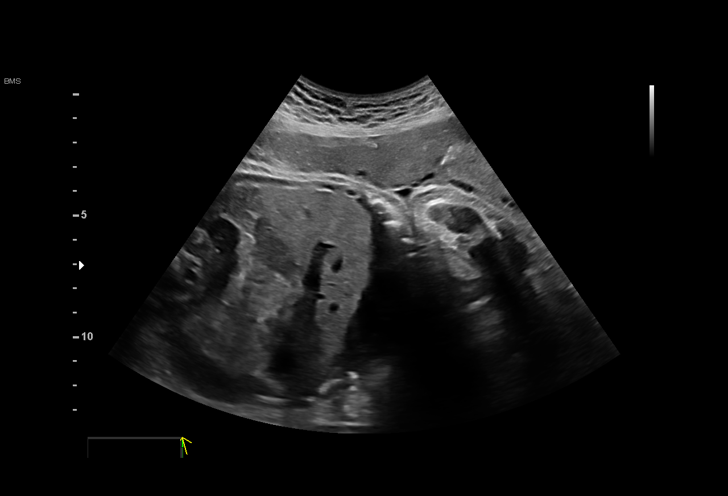
[im 26/51]
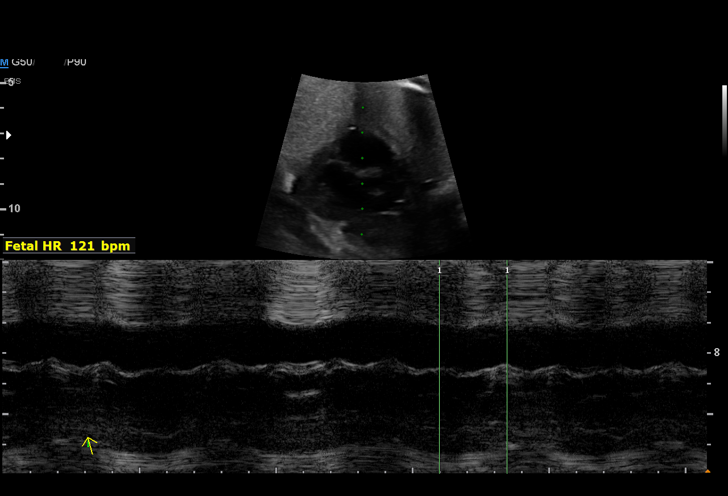
[im 30/51]
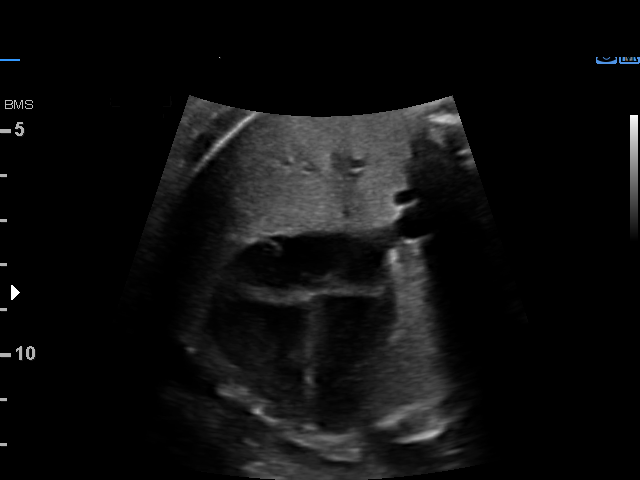
[im 34/51]
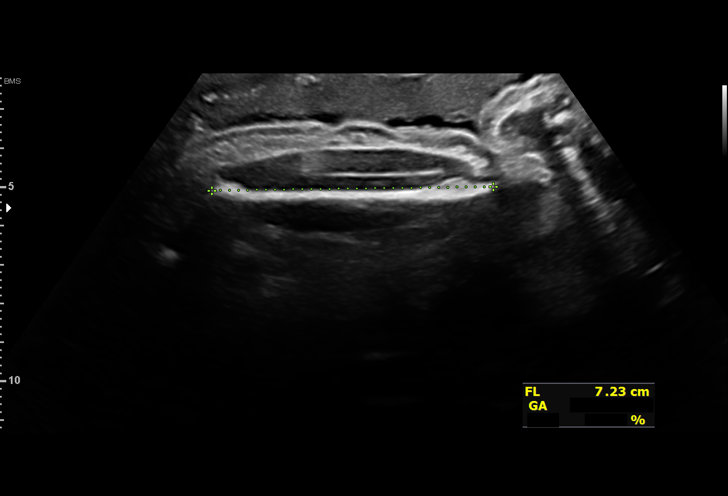
[im 38/51]
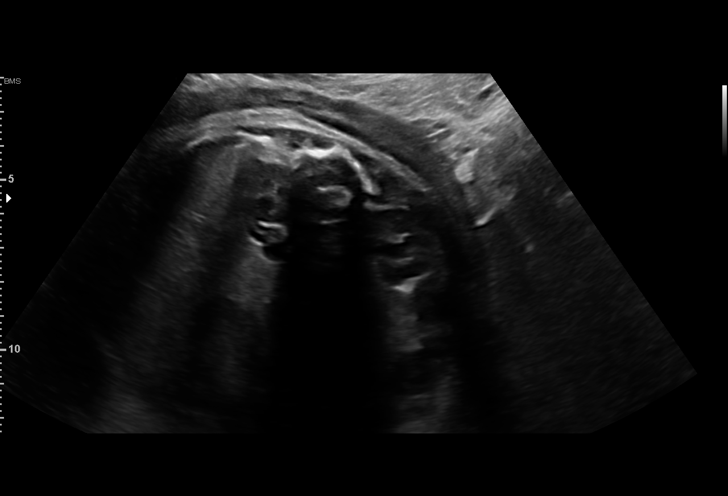
[im 41/51]
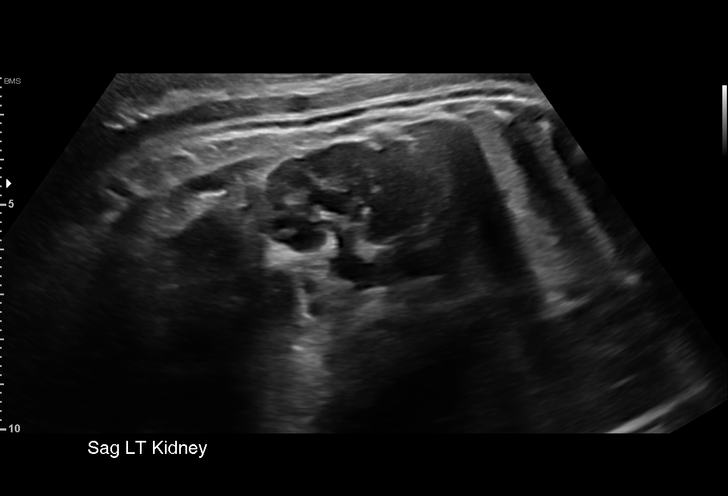
[im 45/51]
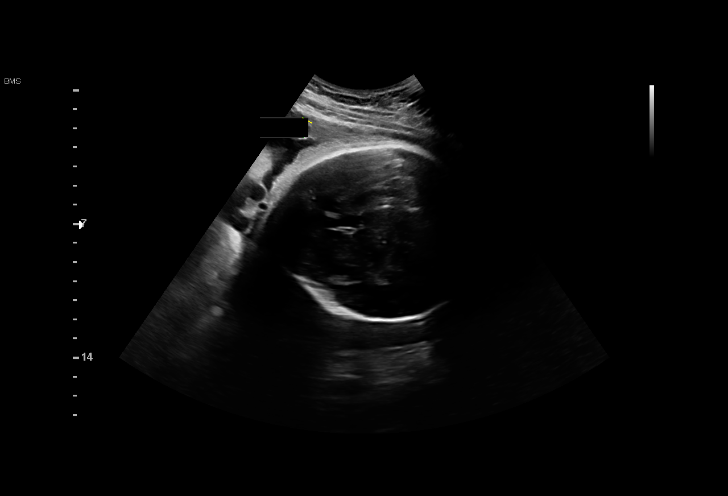
[im 49/51]
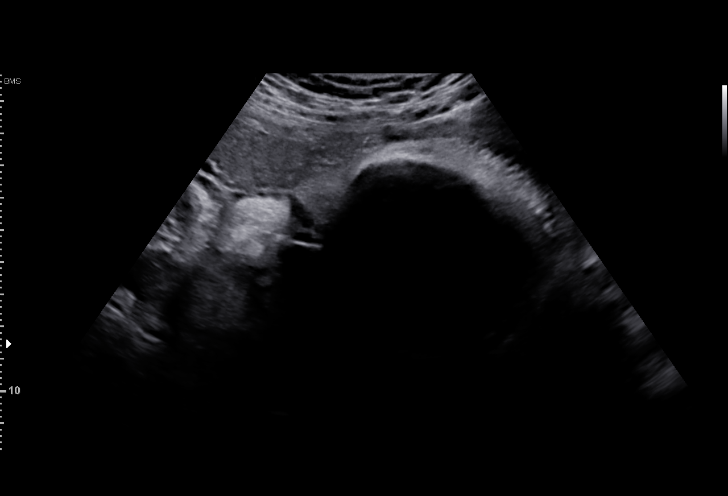

[13 of 28 positions shown; findings below may reference images not displayed]

HARTNETT

Indications

 Grand multiparity, antepartum
 Encounter for other antenatal screening
 follow-up
 Low risk NIPS, 7.3 FF, neg AFP
 History of sickle cell trait
 Obesity complicating pregnancy, third
 trimester (BMI 37.7)
 36 weeks gestation of pregnancy
Fetal Evaluation

 Num Of Fetuses:         1
 Fetal Heart Rate(bpm):  121
 Cardiac Activity:       Observed
 Presentation:           Cephalic
 Placenta:               Anterior
 P. Cord Insertion:      Visualized, central

 Amniotic Fluid
 AFI FV:      Within normal limits

 AFI Sum(cm)     %Tile       Largest Pocket(cm)
 9.16            16

 RUQ(cm)       RLQ(cm)       LUQ(cm)        LLQ(cm)

Biometry

 BPD:        87  mm     G. Age:  35w 1d         25  %    CI:        74.68   %    70 - 86
                                                         FL/HC:      22.5   %    20.1 -
 HC:      319.5  mm     G. Age:  36w 0d         14  %    HC/AC:      0.91        0.93 -
 AC:      351.2  mm     G. Age:  39w 0d         99  %    FL/BPD:     82.8   %    71 - 87
 FL:         72  mm     G. Age:  36w 6d         59  %    FL/AC:      20.5   %    20 - 24

 LV:        4.5  mm

 Est. FW:    7697  gm      7 lb 4 oz     85  %
OB History

 Gravidity:    7         Term:   6        Prem:   0        SAB:   0
 TOP:          0       Ectopic:  0        Living: 6
Gestational Age

 LMP:           36w 3d        Date:  06/21/19                 EDD:   03/27/20
 U/S Today:     36w 5d                                        EDD:   03/25/20
 Best:          36w 3d     Det. By:  LMP  (06/21/19)          EDD:   03/27/20
Anatomy

 Cranium:               Previously seen        LVOT:                   Previously seen
 Cavum:                 Previously seen        Aortic Arch:            Previously seen
 Ventricles:            Appears normal         Ductal Arch:            Previously seen
 Choroid Plexus:        Previously seen        Diaphragm:              Appears normal
 Cerebellum:            Previously seen        Stomach:                Appears normal, left
                                                                       sided
 Posterior Fossa:       Previously seen        Abdomen:                Appears normal
 Nuchal Fold:           Previously seen        Abdominal Wall:         Previously seen
 Face:                  Orbits and profile     Cord Vessels:           Previously seen
                        previously seen
 Lips:                  Previously seen        Kidneys:                Appear normal
 Palate:                Previously seen        Bladder:                Appears normal
 Thoracic:              Previously seen        Spine:                  Previously seen
 Heart:                 Previously seen        Upper Extremities:      Previously seen
 RVOT:                  Previously seen        Lower Extremities:      Previously seen

 Other:  Heels prev visualized. Fetus appears to be female. Technically
         difficult due to fetal position.
Cervix Uterus Adnexa

 Cervix
 Not visualized (advanced GA >91wks)

 Uterus
 No abnormality visualized.

 Right Ovary
 Not visualized.

 Left Ovary
 Within normal limits.
 Cul De Sac
 No free fluid seen.

 Adnexa
 No adnexal mass visualized.
Comments

 This patient was seen for a follow up growth scan due to
 grand multi parity and maternal obesity.  She denies any
 problems since her last exam.
 She was informed that the fetal growth and amniotic fluid
 level appears appropriate for her gestational age.
 As the fetal growth is within normal limits, no further exams
 were scheduled in our office.

## 2021-04-19 ENCOUNTER — Ambulatory Visit (INDEPENDENT_AMBULATORY_CARE_PROVIDER_SITE_OTHER): Payer: Medicaid Other | Admitting: Obstetrics and Gynecology

## 2021-04-19 ENCOUNTER — Encounter: Payer: Self-pay | Admitting: Obstetrics and Gynecology

## 2021-04-19 ENCOUNTER — Other Ambulatory Visit: Payer: Self-pay

## 2021-04-19 VITALS — BP 113/78 | HR 109 | Wt 225.0 lb

## 2021-04-19 DIAGNOSIS — Z3482 Encounter for supervision of other normal pregnancy, second trimester: Secondary | ICD-10-CM

## 2021-04-19 DIAGNOSIS — Z641 Problems related to multiparity: Secondary | ICD-10-CM

## 2021-04-19 DIAGNOSIS — R002 Palpitations: Secondary | ICD-10-CM

## 2021-04-19 DIAGNOSIS — Z789 Other specified health status: Secondary | ICD-10-CM

## 2021-04-19 DIAGNOSIS — D573 Sickle-cell trait: Secondary | ICD-10-CM

## 2021-04-19 NOTE — Progress Notes (Signed)
° °  PRENATAL VISIT NOTE  Subjective:  Gina Walton is a 35 y.o. G8P7007 at [redacted]w[redacted]d being seen today for ongoing prenatal care.  She is currently monitored for the following issues for this low-risk pregnancy and has Sickle cell trait (HCC); Grand multipara; Language barrier; and Supervision of normal pregnancy on their problem list.  Patient reports  intermittent palpitations that come and go - some days she has them and some she does not. They usually last no more than 10-15 min and go away. She denies CP/SOB .  She recalls having this with perhaps her second pregnancy but not any of her others. Contractions: Not present. Vag. Bleeding: None.  Movement: Present. Denies leaking of fluid.   The following portions of the patient's history were reviewed and updated as appropriate: allergies, current medications, past family history, past medical history, past social history, past surgical history and problem list.   Objective:   Vitals:   04/19/21 1603  BP: 113/78  Pulse: (!) 109  Weight: 225 lb (102.1 kg)    Fetal Status: Fetal Heart Rate (bpm): 156   Movement: Present     General:  Alert, oriented and cooperative. Patient is in no acute distress.  Skin: Skin is warm and dry. No rash noted.   Cardiovascular: Normal heart rate noted  Respiratory: Normal respiratory effort, no problems with respiration noted  Abdomen: Soft, gravid, appropriate for gestational age.  Pain/Pressure: Absent     Pelvic: Cervical exam deferred        Extremities: Normal range of motion.  Edema: None  Mental Status: Normal mood and affect. Normal behavior. Normal judgment and thought content.   Assessment and Plan:  Pregnancy: [id number] at [redacted]w[redacted]d 1. Grand multipara Growth normal at anatomy. Continue Q4wk [place] and weekly BPP at 34w per MFM Next [place] is 1/10  2. Language barrier Swahili speaking - interpreter used  3. Encounter for supervision of other normal pregnancy in second trimester 28w labs next time along  with tdap She is s/p flu shot   4. Sickle cell trait (HCC) No children affected with SCD  5. Palpitations No CP/SOB at this time and sporadic in nature - usually associated with work.  She will continue to hydrate.  We discussed concerning signs/symptoms - CP/SOB - go to MAU. If increasing frequency or lasting longer palpitations then we will refer to [facility].   Preterm labor symptoms and general obstetric precautions including but not limited to vaginal bleeding, contractions, leaking of fluid and fetal movement were reviewed in detail with the patient. Please refer to After Visit Summary for other counseling recommendations.   Return in about 3 weeks (around 05/10/2021) for OB VISIT, MD or APP, 2 hr GTT, Tdap.  Future Appointments  Date Time Provider Department Center  05/02/2021  2:00 PM [facility] NURSE [facility]  05/02/2021  2:15 PM [facility]2 [facility]    Milas Hock, MD

## 2021-04-23 NOTE — L&D Delivery Note (Signed)
OB/GYN Faculty Practice Delivery Note ? ?Gina Walton is a 36 y.o. B0F7510 s/p SVD at [redacted]w[redacted]d. She was admitted for active labor.  ? ?ROM: 6h 37m with clear fluid ?GBS Status: Positive ?Maximum Maternal Temperature: 99.1 ? ?Labor Progress: ?Patient presented in active labor and progressed to 7cm.  She was then AROM'd and progressed to complete. She delivered as below with staff and husband support.  ? ?Delivery Date/Time: July 16, 2021 at 0747 ?Delivery: Called to room and patient was complete and pushing. Head delivered LOA with compound right hand and nuchal cord present that shoulders and body was delivered through via somersault maneuver. Infant with spontaneous cry and dried and stimulated by provider, before being placed on mother's abdomen. Cord clamped x 2 after 3-minute delay, and cut by Husband-Bashige. Cord blood drawn. Pitocin and TXA started for active management of 3rd stage.  Placenta delivered spontaneously with gentle cord traction. Labia, perineum, vagina, and cervical inspection revealed a first degree perineal laceration that was repaired with 3-0 vicryl on SH.  29mL of lidocaine 1% anesthetic infused and patient tolerated repair well. Fundus firm, at the umbilicus, and bleeding small.  Mother hemodynamically stable and infant skin to skin prior to provider exit.  Mother desires Outpt IUD for birth control and opts to breastfeed.  Infant weight at one hour of life:  ? ?Placenta: Intact, To Disposal ?Complications: None ?Lacerations: 1st Degree Perineal ?EBL: 250 ?Analgesia: Local ? ?Infant: Female-Sabin  APGARs 8, 9  3844g-8lb7.6oz, 20.5 in ? ? ?Postpartum Planning ?Arly.Keller ] PPV Message Sent ?Arly.Keller ] Delivery Summary ?Arly.Keller ] Orders for Transfer ? ? ?Cherre Robins, CNM  ?07/16/2021 8:38 AM ? ?

## 2021-05-02 ENCOUNTER — Other Ambulatory Visit: Payer: Self-pay

## 2021-05-02 ENCOUNTER — Ambulatory Visit: Payer: Medicaid Other | Attending: Obstetrics and Gynecology

## 2021-05-02 ENCOUNTER — Ambulatory Visit: Payer: Medicaid Other | Admitting: *Deleted

## 2021-05-02 VITALS — BP 91/57 | HR 102

## 2021-05-02 DIAGNOSIS — Z3483 Encounter for supervision of other normal pregnancy, third trimester: Secondary | ICD-10-CM

## 2021-05-02 DIAGNOSIS — O99213 Obesity complicating pregnancy, third trimester: Secondary | ICD-10-CM | POA: Insufficient documentation

## 2021-05-02 DIAGNOSIS — Z148 Genetic carrier of other disease: Secondary | ICD-10-CM | POA: Diagnosis not present

## 2021-05-02 DIAGNOSIS — E669 Obesity, unspecified: Secondary | ICD-10-CM | POA: Diagnosis not present

## 2021-05-02 DIAGNOSIS — Z362 Encounter for other antenatal screening follow-up: Secondary | ICD-10-CM | POA: Diagnosis not present

## 2021-05-02 DIAGNOSIS — Z3A28 28 weeks gestation of pregnancy: Secondary | ICD-10-CM | POA: Insufficient documentation

## 2021-05-02 DIAGNOSIS — O99212 Obesity complicating pregnancy, second trimester: Secondary | ICD-10-CM | POA: Diagnosis present

## 2021-05-03 ENCOUNTER — Other Ambulatory Visit: Payer: Self-pay | Admitting: *Deleted

## 2021-05-03 DIAGNOSIS — Z6841 Body Mass Index (BMI) 40.0 and over, adult: Secondary | ICD-10-CM

## 2021-05-10 ENCOUNTER — Other Ambulatory Visit: Payer: Self-pay

## 2021-05-10 ENCOUNTER — Encounter: Payer: Self-pay | Admitting: Family Medicine

## 2021-05-10 ENCOUNTER — Other Ambulatory Visit: Payer: Medicaid Other

## 2021-05-10 ENCOUNTER — Ambulatory Visit (INDEPENDENT_AMBULATORY_CARE_PROVIDER_SITE_OTHER): Payer: Medicaid Other | Admitting: Family Medicine

## 2021-05-10 VITALS — BP 106/67 | HR 90 | Wt 225.0 lb

## 2021-05-10 DIAGNOSIS — B3731 Acute candidiasis of vulva and vagina: Secondary | ICD-10-CM

## 2021-05-10 DIAGNOSIS — Z3A29 29 weeks gestation of pregnancy: Secondary | ICD-10-CM | POA: Diagnosis not present

## 2021-05-10 DIAGNOSIS — D573 Sickle-cell trait: Secondary | ICD-10-CM

## 2021-05-10 DIAGNOSIS — Z3483 Encounter for supervision of other normal pregnancy, third trimester: Secondary | ICD-10-CM | POA: Diagnosis not present

## 2021-05-10 DIAGNOSIS — B9689 Other specified bacterial agents as the cause of diseases classified elsewhere: Secondary | ICD-10-CM

## 2021-05-10 DIAGNOSIS — Z789 Other specified health status: Secondary | ICD-10-CM

## 2021-05-10 DIAGNOSIS — N760 Acute vaginitis: Secondary | ICD-10-CM

## 2021-05-10 DIAGNOSIS — Z23 Encounter for immunization: Secondary | ICD-10-CM | POA: Diagnosis not present

## 2021-05-10 DIAGNOSIS — Z641 Problems related to multiparity: Secondary | ICD-10-CM

## 2021-05-10 MED ORDER — METRONIDAZOLE 500 MG PO TABS: 500.0000 mg | ORAL_TABLET | Freq: Two times a day (BID) | ORAL | 0 refills | Status: AC

## 2021-05-10 MED ORDER — MICONAZOLE NITRATE 2 % VA CREA: 1.0000 | TOPICAL_CREAM | Freq: Every day | VAGINAL | 0 refills | Status: AC

## 2021-05-10 NOTE — Progress Notes (Signed)
PRENATAL VISIT NOTE  Subjective:  Gina Walton is a 36 y.o. G8P7007 at [redacted]w[redacted]d being seen today for ongoing prenatal care.  She is currently monitored for the following issues for this low-risk pregnancy and has Sickle cell trait (HCC); Grand multipara; Language barrier; and Supervision of normal pregnancy on their problem list.  Patient reports no complaints.  Contractions: Not present. Vag. Bleeding: None.  Movement: Present. Denies leaking of fluid.   The following portions of the patient's history were reviewed and updated as appropriate: allergies, current medications, past family history, past medical history, past social history, past surgical history and problem list.   Objective:   Vitals:   05/10/21 1015  BP: 106/67  Pulse: 90  Weight: 225 lb (102.1 kg)    Fetal Status: Fetal Heart Rate (bpm): 141 Fundal Height: 30 cm Movement: Present  General:  Alert, oriented and cooperative. Patient is in no acute distress.  Skin: Skin is warm and dry. No rash noted.   Cardiovascular: Normal heart rate noted.  Respiratory: Normal respiratory effort, no problems with respiration noted.  Abdomen: Soft, gravid, appropriate for gestational age.  Pain/Pressure: Absent.     Pelvic: Cervical exam deferred.  Extremities: Normal range of motion.  Edema: None.  Mental Status: Normal mood and affect. Normal behavior. Normal judgment and thought content.   Assessment and Plan:  Pregnancy: [id number] at [redacted]w[redacted]d  1. Encounter for supervision of other normal pregnancy in third trimester 2. [redacted] weeks gestation of pregnancy Progressing well. No concerns today. FH and FHT within normal limits. 28 week labs and Tdap completed today; will follow up results. Next OB visit in 2 weeks. - Glucose Tolerance, 2 Hours w/1 Hour - RPR - CBC - HIV antibody (with reflex) - Tdap vaccine greater than or equal to 7yo IM  3. Grand multipara Following with MFM for growth [place] and antenatal testing. Reassuring thus  far. Will continue to follow MFM recommendations.   4. Sickle cell trait (HCC) No children affected. Referred to genetic counseling.  5. Language barrier In-person interpreter used for entirety of visit.   6. BV (bacterial vaginosis) 7. Candida vaginitis Noted on initial prenatal labs. Went to pick up medications and was told it would be 70 dollars; did not take medications due to this. Resent to preferred pharmacy today. Advised to call office if pharmacy charges significant price for medications, as they should be more affordable with Medicaid. Patient voiced understanding. - metroNIDAZOLE (FLAGYL) 500 MG tablet; Take 1 tablet (500 mg total) by mouth 2 (two) times daily for 7 days.  Dispense: 14 tablet; Refill: 0 - miconazole (MONISTAT 7) 2 % vaginal cream; Place 1 Applicatorful vaginally at bedtime for 7 days.  Dispense: 45 g; Refill: 0  Preterm labor symptoms and general obstetric precautions including but not limited to vaginal bleeding, contractions, leaking of fluid and fetal movement were reviewed in detail with the patient.  Please refer to After Visit Summary for other counseling recommendations.   Return in about 2 weeks (around 05/24/2021) for follow up OB visit.  Future Appointments  Date Time Provider Department Center  05/24/2021  3:30 PM Gerrit Heck, CNM [facility] None  05/30/2021  3:15 PM [facility] NURSE [facility]  05/30/2021  3:30 PM [facility] US3 [facility]  06/13/2021  3:15 PM [facility]SE [facility]  06/13/2021  3:30 PM [facility] US3 [facility]  06/20/2021  3:15 PM [facility] NURSE [facility]  06/20/2021  3:30 PM [facility] US3 [facility]   Worthy Rancher,  MD

## 2021-05-10 NOTE — Progress Notes (Signed)
ROB/GTT.   TDAP given in LD, tolerated well. ?

## 2021-05-11 LAB — HIV ANTIBODY (ROUTINE TESTING W REFLEX): HIV Screen 4th Generation wRfx: NONREACTIVE

## 2021-05-11 LAB — CBC
Hematocrit: 34 % (ref 34.0–46.6)
Hemoglobin: 11.9 g/dL (ref 11.1–15.9)
MCH: 25.7 pg — ABNORMAL LOW (ref 26.6–33.0)
MCHC: 35 g/dL (ref 31.5–35.7)
MCV: 73 fL — ABNORMAL LOW (ref 79–97)
Platelets: 239 10*3/uL (ref 150–450)
RBC: 4.63 x10E6/uL (ref 3.77–5.28)
RDW: 14.3 % (ref 11.7–15.4)
WBC: 6.2 10*3/uL (ref 3.4–10.8)

## 2021-05-11 LAB — RPR: RPR Ser Ql: NONREACTIVE

## 2021-05-11 LAB — GLUCOSE TOLERANCE, 2 HOURS W/ 1HR
Glucose, 1 hour: 131 mg/dL (ref 70–179)
Glucose, 2 hour: 115 mg/dL (ref 70–152)
Glucose, Fasting: 86 mg/dL (ref 70–91)

## 2021-05-12 ENCOUNTER — Telehealth: Payer: Self-pay

## 2021-05-12 NOTE — Telephone Encounter (Signed)
Attempted to call about results, no answer, unable to leave vm

## 2021-05-24 ENCOUNTER — Ambulatory Visit (INDEPENDENT_AMBULATORY_CARE_PROVIDER_SITE_OTHER): Payer: Medicaid Other

## 2021-05-24 ENCOUNTER — Other Ambulatory Visit: Payer: Self-pay

## 2021-05-24 VITALS — BP 106/73 | HR 110 | Wt 227.0 lb

## 2021-05-24 DIAGNOSIS — Z3483 Encounter for supervision of other normal pregnancy, third trimester: Secondary | ICD-10-CM

## 2021-05-24 DIAGNOSIS — Z789 Other specified health status: Secondary | ICD-10-CM

## 2021-05-24 DIAGNOSIS — Z3A31 31 weeks gestation of pregnancy: Secondary | ICD-10-CM

## 2021-05-24 DIAGNOSIS — K5900 Constipation, unspecified: Secondary | ICD-10-CM

## 2021-05-24 DIAGNOSIS — O99613 Diseases of the digestive system complicating pregnancy, third trimester: Secondary | ICD-10-CM

## 2021-05-24 DIAGNOSIS — Z603 Acculturation difficulty: Secondary | ICD-10-CM

## 2021-05-24 NOTE — Progress Notes (Signed)
° °  LOW-RISK PREGNANCY OFFICE VISIT  Patient name: Gina Walton MRN [id number]  Date of birth: 08/05/1985 Chief Complaint:   Routine Prenatal Visit  Subjective:   Gina Walton is a 36 y.o. [id number] female at [redacted]w[redacted]d with an Estimated Date of Delivery: 07/20/21 being seen today for ongoing management of a low-risk pregnancy aeb has Sickle cell trait (HCC); Grand multipara; Language barrier; and Supervision of normal pregnancy on their problem list.  Patient presents today with  constipation .  She states she hasn't had a bowel movement despite increase fruit and water in her diet.  Patient endorses fetal movement. Patient denies abdominal cramping or contractions.  Patient denies vaginal concerns including abnormal discharge, leaking of fluid, and bleeding.  Contractions: Not present. Vag. Bleeding: None.  Movement: Present.  Reviewed past medical,surgical, social, obstetrical and family history as well as problem list, medications and allergies.  Objective   Vitals:   05/24/21 1602  BP: 106/73  Pulse: (!) 110  Weight: 227 lb (103 kg)  Body mass index is 44.33 kg/m.  Total Weight Gain:7 lb (3.175 kg)         Physical Examination:   General appearance: Well appearing, and in no distress  Mental status: Alert, oriented to person, place, and time  Skin: Warm & dry  Cardiovascular: Normal heart rate noted  Respiratory: Normal respiratory effort, no distress  Abdomen: Soft, gravid, nontender, AGA with Fundal Height: 33 cm  Pelvic: Cervical exam deferred           Extremities: Edema: None  Fetal Status: Fetal Heart Rate (bpm): 145   Movement: Present   No results found for this or any previous visit (from the past 24 hour(s)).  Assessment & Plan:  Low-risk pregnancy of a 36 y.o., [id number] at [redacted]w[redacted]d with an Estimated Date of Delivery: 07/20/21   1. Encounter for supervision of other normal pregnancy in third trimester -Anticipatory guidance for upcoming appts. -Patient to schedule  next appt in 2 weeks for an in-person visit.  2. [redacted] weeks gestation of pregnancy -Doing well overall -Scheduled for BPP and growth [place]  3. Language barrier -In person interpreter unable to stay for visit. -Video interpreter utilized: Stany [phone]  4. Constipation during pregnancy in third trimester -Discussed ways to decrease constipation. -Given option  of medication and patient agreeable. -Rx for Miralax sent to pharmacy on file.  -Patient instructed to use as prescribed and report any issues. -Cautioned to discontinue with onset of diarrhea.      Meds: No orders of the defined types were placed in this encounter.  Labs/procedures today:  Lab Orders  No laboratory test(s) ordered today     Reviewed: Preterm labor symptoms and general obstetric precautions including but not limited to vaginal bleeding, contractions, leaking of fluid and fetal movement were reviewed in detail with the patient.  All questions were answered.  Follow-up: No follow-ups on file.  No orders of the defined types were placed in this encounter.  Cherre Robins MSN, CNM 05/24/2021

## 2021-05-24 NOTE — Progress Notes (Signed)
Pt presents for ROB reports constipation x 4 days.

## 2021-05-25 MED ORDER — POLYETHYLENE GLYCOL 3350 17 G PO PACK: 17.0000 g | PACK | Freq: Every day | ORAL | 1 refills | Status: DC

## 2021-05-30 ENCOUNTER — Ambulatory Visit: Payer: Medicaid Other | Attending: Maternal & Fetal Medicine

## 2021-05-30 ENCOUNTER — Other Ambulatory Visit: Payer: Self-pay

## 2021-05-30 ENCOUNTER — Ambulatory Visit: Payer: Medicaid Other | Admitting: *Deleted

## 2021-05-30 VITALS — BP 98/60 | HR 98

## 2021-05-30 DIAGNOSIS — Z3A32 32 weeks gestation of pregnancy: Secondary | ICD-10-CM | POA: Insufficient documentation

## 2021-05-30 DIAGNOSIS — D573 Sickle-cell trait: Secondary | ICD-10-CM | POA: Insufficient documentation

## 2021-05-30 DIAGNOSIS — O99213 Obesity complicating pregnancy, third trimester: Secondary | ICD-10-CM | POA: Diagnosis not present

## 2021-05-30 DIAGNOSIS — O99013 Anemia complicating pregnancy, third trimester: Secondary | ICD-10-CM | POA: Diagnosis not present

## 2021-05-30 DIAGNOSIS — Z362 Encounter for other antenatal screening follow-up: Secondary | ICD-10-CM | POA: Diagnosis not present

## 2021-05-30 DIAGNOSIS — Z3483 Encounter for supervision of other normal pregnancy, third trimester: Secondary | ICD-10-CM | POA: Insufficient documentation

## 2021-05-30 DIAGNOSIS — E668 Other obesity: Secondary | ICD-10-CM | POA: Diagnosis not present

## 2021-05-30 DIAGNOSIS — Z148 Genetic carrier of other disease: Secondary | ICD-10-CM | POA: Insufficient documentation

## 2021-05-30 DIAGNOSIS — Z6841 Body Mass Index (BMI) 40.0 and over, adult: Secondary | ICD-10-CM

## 2021-05-30 DIAGNOSIS — O0943 Supervision of pregnancy with grand multiparity, third trimester: Secondary | ICD-10-CM | POA: Diagnosis not present

## 2021-05-31 ENCOUNTER — Other Ambulatory Visit: Payer: Self-pay | Admitting: *Deleted

## 2021-05-31 DIAGNOSIS — O0943 Supervision of pregnancy with grand multiparity, third trimester: Secondary | ICD-10-CM

## 2021-05-31 DIAGNOSIS — Z6841 Body Mass Index (BMI) 40.0 and over, adult: Secondary | ICD-10-CM

## 2021-06-07 ENCOUNTER — Ambulatory Visit (INDEPENDENT_AMBULATORY_CARE_PROVIDER_SITE_OTHER): Payer: Medicaid Other | Admitting: Obstetrics & Gynecology

## 2021-06-07 ENCOUNTER — Other Ambulatory Visit: Payer: Self-pay

## 2021-06-07 VITALS — BP 107/74 | HR 84 | Wt 218.0 lb

## 2021-06-07 DIAGNOSIS — M549 Dorsalgia, unspecified: Secondary | ICD-10-CM

## 2021-06-07 DIAGNOSIS — Z641 Problems related to multiparity: Secondary | ICD-10-CM

## 2021-06-07 DIAGNOSIS — Z789 Other specified health status: Secondary | ICD-10-CM

## 2021-06-07 DIAGNOSIS — Z3483 Encounter for supervision of other normal pregnancy, third trimester: Secondary | ICD-10-CM

## 2021-06-07 MED ORDER — CYCLOBENZAPRINE HCL 5 MG PO TABS
5.0000 mg | ORAL_TABLET | Freq: Three times a day (TID) | ORAL | 1 refills | Status: DC | PRN
Start: 2021-06-07 — End: 2021-07-17

## 2021-06-07 NOTE — Progress Notes (Signed)
° °  PRENATAL VISIT NOTE  Subjective:  Gina Walton is a 36 y.o. [id number] at [redacted]w[redacted]d being seen today for ongoing prenatal care.  She is currently monitored for the following issues for this low-risk pregnancy and has Sickle cell trait (HCC); Grand multipara; Language barrier; and Supervision of normal pregnancy on their problem list.  Patient reports backache.  Contractions: Not present. Vag. Bleeding: None.  Movement: Present. Denies leaking of fluid.   The following portions of the patient's history were reviewed and updated as appropriate: allergies, current medications, past family history, past medical history, past social history, past surgical history and problem list.   Objective:   Vitals:   06/07/21 1609  BP: 107/74  Pulse: 84  Weight: 218 lb (98.9 kg)    Fetal Status: Fetal Heart Rate (bpm): 140   Movement: Present     General:  Alert, oriented and cooperative. Patient is in no acute distress.  Skin: Skin is warm and dry. No rash noted.   Cardiovascular: Normal heart rate noted  Respiratory: Normal respiratory effort, no problems with respiration noted  Abdomen: Soft, gravid, appropriate for gestational age.  Pain/Pressure: Present     Pelvic: Cervical exam deferred        Extremities: Normal range of motion.  Edema: None  Mental Status: Normal mood and affect. Normal behavior. Normal judgment and thought content.   Assessment and Plan:  Pregnancy: [id number] at [redacted]w[redacted]d 1. Encounter for supervision of other normal pregnancy in third trimester Has f/u US next week  2. Language barrier Swahili interpreter  3. Grand multipara [phone]   4. Backache symptom Intermittent low back pain, pain will last for 2 min and she massages her back for relief, does bother her at night. She stopped work 2 weeks ago and requests a note stating she has back pain - cyclobenzaprine (FLEXERIL) 5 MG tablet; Take 1 tablet (5 mg total) by mouth every 8 (eight) hours as needed for muscle spasms.   Dispense: 20 tablet; Refill: 1  Preterm labor symptoms and general obstetric precautions including but not limited to vaginal bleeding, contractions, leaking of fluid and fetal movement were reviewed in detail with the patient. Please refer to After Visit Summary for other counseling recommendations.   Return in about 2 weeks (around 06/21/2021).  Future Appointments  Date Time Provider Department Center  06/13/2021  3:15 PM [facility] NURSE [facility]  06/13/2021  3:30 PM [facility] US3 [facility]  06/20/2021  3:15 PM [facility] NURSE [facility]  06/20/2021  3:30 PM [facility] US[facility]  06/27/2021  3:30 PM [facility] NURSE [facility]  06/27/2021  3:45 PM [facility] US5 [facility]    Scheryl Darter, MD

## 2021-06-07 NOTE — Progress Notes (Signed)
ROB c/o back pain 3/10

## 2021-06-13 ENCOUNTER — Ambulatory Visit: Payer: Medicaid Other | Admitting: *Deleted

## 2021-06-13 ENCOUNTER — Ambulatory Visit: Payer: Medicaid Other | Attending: Maternal & Fetal Medicine

## 2021-06-13 ENCOUNTER — Other Ambulatory Visit: Payer: Self-pay

## 2021-06-13 VITALS — BP 97/70 | HR 115

## 2021-06-13 DIAGNOSIS — O99213 Obesity complicating pregnancy, third trimester: Secondary | ICD-10-CM | POA: Diagnosis not present

## 2021-06-13 DIAGNOSIS — Z3A34 34 weeks gestation of pregnancy: Secondary | ICD-10-CM | POA: Diagnosis not present

## 2021-06-13 DIAGNOSIS — Z148 Genetic carrier of other disease: Secondary | ICD-10-CM | POA: Diagnosis not present

## 2021-06-13 DIAGNOSIS — Z6841 Body Mass Index (BMI) 40.0 and over, adult: Secondary | ICD-10-CM

## 2021-06-13 DIAGNOSIS — Z362 Encounter for other antenatal screening follow-up: Secondary | ICD-10-CM | POA: Insufficient documentation

## 2021-06-13 DIAGNOSIS — O0943 Supervision of pregnancy with grand multiparity, third trimester: Secondary | ICD-10-CM | POA: Insufficient documentation

## 2021-06-13 DIAGNOSIS — Z3483 Encounter for supervision of other normal pregnancy, third trimester: Secondary | ICD-10-CM

## 2021-06-14 ENCOUNTER — Other Ambulatory Visit: Payer: Self-pay | Admitting: *Deleted

## 2021-06-20 ENCOUNTER — Ambulatory Visit: Payer: Medicaid Other | Admitting: *Deleted

## 2021-06-20 ENCOUNTER — Other Ambulatory Visit: Payer: Self-pay

## 2021-06-20 ENCOUNTER — Ambulatory Visit: Payer: Medicaid Other | Attending: Maternal & Fetal Medicine

## 2021-06-20 VITALS — BP 103/68 | HR 110

## 2021-06-20 DIAGNOSIS — O99213 Obesity complicating pregnancy, third trimester: Secondary | ICD-10-CM | POA: Insufficient documentation

## 2021-06-20 DIAGNOSIS — Z6841 Body Mass Index (BMI) 40.0 and over, adult: Secondary | ICD-10-CM

## 2021-06-20 DIAGNOSIS — O0943 Supervision of pregnancy with grand multiparity, third trimester: Secondary | ICD-10-CM | POA: Diagnosis not present

## 2021-06-20 DIAGNOSIS — Z148 Genetic carrier of other disease: Secondary | ICD-10-CM | POA: Diagnosis not present

## 2021-06-20 DIAGNOSIS — Z3A35 35 weeks gestation of pregnancy: Secondary | ICD-10-CM | POA: Diagnosis not present

## 2021-06-20 DIAGNOSIS — Z3483 Encounter for supervision of other normal pregnancy, third trimester: Secondary | ICD-10-CM

## 2021-06-21 ENCOUNTER — Other Ambulatory Visit: Payer: Self-pay | Admitting: *Deleted

## 2021-06-21 DIAGNOSIS — R638 Other symptoms and signs concerning food and fluid intake: Secondary | ICD-10-CM

## 2021-06-21 DIAGNOSIS — O0943 Supervision of pregnancy with grand multiparity, third trimester: Secondary | ICD-10-CM

## 2021-06-22 ENCOUNTER — Other Ambulatory Visit (HOSPITAL_COMMUNITY)
Admission: RE | Admit: 2021-06-22 | Discharge: 2021-06-22 | Disposition: A | Discharge status: Home / Self Care | Payer: Medicaid Other | Source: Ambulatory Visit | Attending: Obstetrics | Admitting: Obstetrics

## 2021-06-22 ENCOUNTER — Ambulatory Visit (INDEPENDENT_AMBULATORY_CARE_PROVIDER_SITE_OTHER): Payer: Medicaid Other | Admitting: Obstetrics

## 2021-06-22 ENCOUNTER — Encounter: Payer: Self-pay | Admitting: Obstetrics

## 2021-06-22 ENCOUNTER — Other Ambulatory Visit: Payer: Self-pay

## 2021-06-22 VITALS — BP 101/72 | HR 107 | Wt 223.0 lb

## 2021-06-22 DIAGNOSIS — Z3483 Encounter for supervision of other normal pregnancy, third trimester: Secondary | ICD-10-CM | POA: Diagnosis not present

## 2021-06-22 NOTE — Progress Notes (Signed)
ROB GBS 

## 2021-06-22 NOTE — Progress Notes (Signed)
Subjective:  ?Gina Walton is a 35 y.o. [phone] at [redacted]w[redacted]d being seen today for ongoing prenatal care.  She is currently monitored for the following issues for this low-risk pregnancy and has Sickle cell trait ([place]); [place] multipara; Language barrier; and Supervision of normal pregnancy on their problem list. ? ?Patient reports no complaints.  Contractions: Not present. Vag. Bleeding: None.  Movement: Present. Denies leaking of fluid.  ? ?The following portions of the patient's history were reviewed and updated as appropriate: allergies, current medications, past family history, past medical history, past social history, past surgical history and problem list. Problem list updated. ? ?Objective:  ? ?Vitals:  ? 06/22/21 1020  ?BP: 101/72  ?Pulse: (!) 107  ?Weight: 223 lb (101.2 kg)  ? ? ?Fetal Status: Fetal Heart Rate (bpm): 154   Movement: Present    ? ?General:  Alert, oriented and cooperative. Patient is in no acute distress.  ?Skin: Skin is warm and dry. No rash noted.   ?Cardiovascular: Normal heart rate noted  ?Respiratory: Normal respiratory effort, no problems with respiration noted  ?Abdomen: Soft, gravid, appropriate for gestational age. Pain/Pressure: Absent     ?Pelvic:  Cervical exam deferred        ?Extremities: Normal range of motion.  Edema: None  ?Mental Status: Normal mood and affect. Normal behavior. Normal judgment and thought content.  ? ?Urinalysis:     ? ?Assessment and Plan:  ?Pregnancy: [id number] at [redacted]w[redacted]d ? ?1. Encounter for supervision of other normal pregnancy in third trimester ?Rx: ?- Cervicovaginal ancillary only( [place]) ?- Culture, beta strep (group b only) ? ?Preterm labor symptoms and general obstetric precautions including but not limited to vaginal bleeding, contractions, leaking of fluid and fetal movement were reviewed in detail with the patient. ?Please refer to After Visit Summary for other counseling recommendations.  ? ?Return in about 1 week (around 06/29/2021) for  [place]. ? ? ?Shelly Bombard, MD  ?06/22/21  ?

## 2021-06-23 LAB — CERVICOVAGINAL ANCILLARY ONLY
Bacterial Vaginitis (gardnerella): NEGATIVE
Candida Glabrata: NEGATIVE
Candida Vaginitis: NEGATIVE
Chlamydia: NEGATIVE
Comment: NEGATIVE
Comment: NEGATIVE
Comment: NEGATIVE
Comment: NEGATIVE
Comment: NEGATIVE
Comment: NORMAL
Neisseria Gonorrhea: NEGATIVE
Trichomonas: NEGATIVE

## 2021-06-26 ENCOUNTER — Other Ambulatory Visit: Payer: Self-pay | Admitting: *Deleted

## 2021-06-26 LAB — CULTURE, BETA STREP (GROUP B ONLY): Strep Gp B Culture: POSITIVE — AB

## 2021-06-26 NOTE — Progress Notes (Signed)
Message to next providers to notify pt of GBS+ and treatment in labor at [facility] visit on 06/29/21.

## 2021-06-27 ENCOUNTER — Encounter: Payer: Self-pay | Admitting: *Deleted

## 2021-06-27 ENCOUNTER — Ambulatory Visit: Payer: Medicaid Other | Admitting: *Deleted

## 2021-06-27 ENCOUNTER — Other Ambulatory Visit: Payer: Self-pay

## 2021-06-27 ENCOUNTER — Ambulatory Visit: Payer: Medicaid Other | Attending: Obstetrics

## 2021-06-27 ENCOUNTER — Other Ambulatory Visit: Payer: Self-pay | Admitting: Obstetrics

## 2021-06-27 VITALS — BP 91/58 | HR 115

## 2021-06-27 DIAGNOSIS — Z6841 Body Mass Index (BMI) 40.0 and over, adult: Secondary | ICD-10-CM | POA: Insufficient documentation

## 2021-06-27 DIAGNOSIS — Z3A36 36 weeks gestation of pregnancy: Secondary | ICD-10-CM | POA: Diagnosis not present

## 2021-06-27 DIAGNOSIS — Z148 Genetic carrier of other disease: Secondary | ICD-10-CM | POA: Diagnosis not present

## 2021-06-27 DIAGNOSIS — O0943 Supervision of pregnancy with grand multiparity, third trimester: Secondary | ICD-10-CM | POA: Diagnosis not present

## 2021-06-27 DIAGNOSIS — O283 Abnormal ultrasonic finding on antenatal screening of mother: Secondary | ICD-10-CM

## 2021-06-27 DIAGNOSIS — O285 Abnormal chromosomal and genetic finding on antenatal screening of mother: Secondary | ICD-10-CM | POA: Diagnosis not present

## 2021-06-27 DIAGNOSIS — O99213 Obesity complicating pregnancy, third trimester: Secondary | ICD-10-CM | POA: Diagnosis not present

## 2021-06-27 NOTE — Procedures (Signed)
Esperanza Heir ?1986/01/22 ?[redacted]w[redacted]d ? ?Fetus A Non-Stress Test Interpretation for 06/27/21 ? ?Indication: Unsatisfactory BPP ? ?Fetal Heart Rate A ?Mode: External ?Baseline Rate (A): 140 bpm ?Variability: Moderate ?Accelerations: 15 x 15 ?Decelerations: None ?Multiple birth?: No ? ?Uterine Activity ?Mode: Palpation, Toco ?Contraction Frequency (min): 4-6 ?Contraction Duration (sec): 40-90 ?Contraction Quality: Mild ?Resting Tone Palpated: Relaxed ?Resting Time: Adequate ? ?Interpretation (Fetal Testing) ?Nonstress Test Interpretation: Reactive ?Overall Impression: Reassuring for gestational age ?Comments: Dr. Annamaria Boots reviewed tracing ? ? ?

## 2021-06-29 ENCOUNTER — Other Ambulatory Visit: Payer: Self-pay

## 2021-06-29 ENCOUNTER — Ambulatory Visit (INDEPENDENT_AMBULATORY_CARE_PROVIDER_SITE_OTHER): Payer: Medicaid Other | Admitting: Obstetrics and Gynecology

## 2021-06-29 VITALS — BP 95/69 | HR 92 | Wt 227.0 lb

## 2021-06-29 DIAGNOSIS — Z3A37 37 weeks gestation of pregnancy: Secondary | ICD-10-CM

## 2021-06-29 DIAGNOSIS — O99820 Streptococcus B carrier state complicating pregnancy: Secondary | ICD-10-CM | POA: Insufficient documentation

## 2021-06-29 DIAGNOSIS — Z3483 Encounter for supervision of other normal pregnancy, third trimester: Secondary | ICD-10-CM

## 2021-06-29 DIAGNOSIS — Z758 Other problems related to medical facilities and other health care: Secondary | ICD-10-CM

## 2021-06-29 DIAGNOSIS — D573 Sickle-cell trait: Secondary | ICD-10-CM

## 2021-06-29 DIAGNOSIS — Z789 Other specified health status: Secondary | ICD-10-CM

## 2021-06-29 DIAGNOSIS — Z641 Problems related to multiparity: Secondary | ICD-10-CM

## 2021-06-29 NOTE — Progress Notes (Signed)
? ?  PRENATAL VISIT NOTE ? ?Subjective:  ?Gina Walton is a 36 y.o. [phone] at [redacted]w[redacted]d being seen today for ongoing prenatal care.  She is currently monitored for the following issues for this high-risk pregnancy and has Sickle cell trait ([place]); [place] multipara; Language barrier; Supervision of normal pregnancy; and Group B streptococcal carriage complicating pregnancy on their problem list. ? ?Patient doing well with no acute concerns today. She reports no complaints.  Contractions: Not present. Vag. Bleeding: None.  Movement: Present. Denies leaking of fluid.  ? ?The following portions of the patient's history were reviewed and updated as appropriate: allergies, current medications, past family history, past medical history, past social history, past surgical history and problem list. Problem list updated. ? ?Objective:  ? ?Vitals:  ? 06/29/21 0916 06/29/21 [id number]  ?BP: 96/65 95/69  ?Pulse: 86 92  ?Weight: 227 lb (103 kg) 227 lb (103 kg)  ? ? ?Fetal Status: Fetal Heart Rate (bpm): 143 Fundal Height: 38 cm Movement: Present    ? ?General:  Alert, oriented and cooperative. Patient is in no acute distress.  ?Skin: Skin is warm and dry. No rash noted.   ?Cardiovascular: Normal heart rate noted  ?Respiratory: Normal respiratory effort, no problems with respiration noted  ?Abdomen: Soft, gravid, appropriate for gestational age.  Pain/Pressure: Absent     ?Pelvic: Cervical exam deferred        ?Extremities: Normal range of motion.  Edema: None  ?Mental Status:  Normal mood and affect. Normal behavior. Normal judgment and thought content.  ? ?Assessment and Plan:  ?Pregnancy: [id number] at [redacted]w[redacted]d ? ?1. Encounter for supervision of other normal pregnancy in third trimester ?Continue routine care ? ?2. [redacted] weeks gestation of pregnancy ? ? ?3. Language barrier ?Virtual interpreter used ? ?4. [name] multipara ?Pt desires progesterone IUD ? ?5. Sickle cell trait ([place]) ? ? ?6. Group B streptococcal carriage complicating  pregnancy ?Treat in labor ? ?Term labor symptoms and general obstetric precautions including but not limited to vaginal bleeding, contractions, leaking of fluid and fetal movement were reviewed in detail with the patient. ? ?Please refer to After Visit Summary for other counseling recommendations.  ? ?Return in about 1 week (around 07/06/2021) for ROB, in person. ? ? ?Lynnda Shields, MD ?Faculty Attending ?Center for [place] ?  ?

## 2021-07-04 ENCOUNTER — Other Ambulatory Visit: Payer: Self-pay | Admitting: Obstetrics

## 2021-07-04 ENCOUNTER — Encounter: Payer: Self-pay | Admitting: *Deleted

## 2021-07-04 ENCOUNTER — Ambulatory Visit: Payer: Medicaid Other | Attending: Obstetrics | Admitting: *Deleted

## 2021-07-04 ENCOUNTER — Ambulatory Visit: Payer: Medicaid Other | Admitting: *Deleted

## 2021-07-04 ENCOUNTER — Other Ambulatory Visit: Payer: Self-pay

## 2021-07-04 ENCOUNTER — Ambulatory Visit: Payer: Medicaid Other | Attending: Obstetrics

## 2021-07-04 VITALS — BP 102/65 | HR 85

## 2021-07-04 DIAGNOSIS — O99213 Obesity complicating pregnancy, third trimester: Secondary | ICD-10-CM

## 2021-07-04 DIAGNOSIS — Z3A37 37 weeks gestation of pregnancy: Secondary | ICD-10-CM

## 2021-07-04 DIAGNOSIS — Z6841 Body Mass Index (BMI) 40.0 and over, adult: Secondary | ICD-10-CM | POA: Diagnosis not present

## 2021-07-04 DIAGNOSIS — E669 Obesity, unspecified: Secondary | ICD-10-CM

## 2021-07-04 DIAGNOSIS — O0940 Supervision of pregnancy with grand multiparity, unspecified trimester: Secondary | ICD-10-CM | POA: Diagnosis not present

## 2021-07-04 DIAGNOSIS — O288 Other abnormal findings on antenatal screening of mother: Secondary | ICD-10-CM

## 2021-07-04 NOTE — Procedures (Signed)
Esperanza Heir ?Sep 09, 1985 ?[redacted]w[redacted]d ? ?Fetus A Non-Stress Test Interpretation for 07/04/21 ? ?Indication: Unsatisfactory BPP and BMI 42 ? ?Fetal Heart Rate A ?Mode: External ?Baseline Rate (A): 140 bpm ?Variability: Moderate ?Accelerations: 15 x 15 ?Decelerations: None ?Multiple birth?: No ? ?Uterine Activity ?Mode: Palpation, Toco ?Contraction Frequency (min): Occas ?Contraction Quality: Mild ?Resting Tone Palpated: Relaxed ?Resting Time: Adequate ? ?Interpretation (Fetal Testing) ?Nonstress Test Interpretation: Reactive ?Comments: Dr. Gertie Exon reviewed tracing. ? ? ?

## 2021-07-06 ENCOUNTER — Ambulatory Visit (INDEPENDENT_AMBULATORY_CARE_PROVIDER_SITE_OTHER): Payer: Medicaid Other | Admitting: Obstetrics and Gynecology

## 2021-07-06 ENCOUNTER — Other Ambulatory Visit: Payer: Self-pay

## 2021-07-06 VITALS — BP 112/76 | HR 93 | Wt 224.2 lb

## 2021-07-06 NOTE — Progress Notes (Signed)
Pt in office for ROB visit. She does not have any concerns at this time.  ?

## 2021-07-06 NOTE — Progress Notes (Signed)
? ?  PRENATAL VISIT NOTE ? ?Subjective:  ?Gina Walton is a 36 y.o. [phone] at [redacted]w[redacted]d being seen today for ongoing prenatal care.  She is currently monitored for the following issues for this low-risk pregnancy and has Sickle cell trait (HCC); [place] multipara; Language barrier; Supervision of normal pregnancy; and Group B streptococcal carriage complicating pregnancy on their problem list. ? ?Patient doing well with no acute concerns today. She reports occasional contractions.  Contractions: Irritability.  .  Movement: Present. Denies leaking of fluid.  ? ?The following portions of the patient's history were reviewed and updated as appropriate: allergies, current medications, past family history, past medical history, past social history, past surgical history and problem list. Problem list updated. ? ?Objective:  ? ?Vitals:  ? 07/06/21 0936  ?BP: 112/76  ?Pulse: 93  ?Weight: 224 lb 3.2 oz (101.7 kg)  ? ? ?Fetal Status: Fetal Heart Rate (bpm): 145 Fundal Height: 39 cm Movement: Present    ? ?General:  Alert, oriented and cooperative. Patient is in no acute distress.  ?Skin: Skin is warm and dry. No rash noted.   ?Cardiovascular: Normal heart rate noted  ?Respiratory: Normal respiratory effort, no problems with respiration noted  ?Abdomen: Soft, gravid, appropriate for gestational age.  Pain/Pressure: Present     ?Pelvic: Cervical exam deferred        ?Extremities: Normal range of motion.  Edema: None  ?Mental Status:  Normal mood and affect. Normal behavior. Normal judgment and thought content.  ? ?Assessment and Plan:  ?Pregnancy: [id number] at [redacted]w[redacted]d ? ?1. Encounter for supervision of other normal pregnancy in third trimester ?Continue routine care, weekly BPP 2/2 BMI ? ?2. [redacted] weeks gestation of pregnancy ? ? ?3. Language barrier ?Virtual interpreter used ? ?4. Group B streptococcal carriage complicating pregnancy ?Treat during labor ? ?Term labor symptoms and general obstetric precautions including but not limited  to vaginal bleeding, contractions, leaking of fluid and fetal movement were reviewed in detail with the patient. ? ?Please refer to After Visit Summary for other counseling recommendations.  ? ?Return in about 1 week (around 07/13/2021) for ROB, in person. ? ? ?Mariel Aloe, MD ?Faculty Attending ?Center for [facility] ?  ?

## 2021-07-11 ENCOUNTER — Other Ambulatory Visit: Payer: Self-pay

## 2021-07-11 ENCOUNTER — Ambulatory Visit: Payer: Medicaid Other | Admitting: *Deleted

## 2021-07-11 ENCOUNTER — Ambulatory Visit: Payer: Medicaid Other | Attending: Maternal & Fetal Medicine

## 2021-07-11 VITALS — BP 113/55 | HR 106

## 2021-07-11 DIAGNOSIS — Z3A38 38 weeks gestation of pregnancy: Secondary | ICD-10-CM

## 2021-07-11 DIAGNOSIS — Z148 Genetic carrier of other disease: Secondary | ICD-10-CM | POA: Diagnosis not present

## 2021-07-11 DIAGNOSIS — O0943 Supervision of pregnancy with grand multiparity, third trimester: Secondary | ICD-10-CM

## 2021-07-11 DIAGNOSIS — Z362 Encounter for other antenatal screening follow-up: Secondary | ICD-10-CM | POA: Diagnosis not present

## 2021-07-11 DIAGNOSIS — O285 Abnormal chromosomal and genetic finding on antenatal screening of mother: Secondary | ICD-10-CM | POA: Diagnosis not present

## 2021-07-11 DIAGNOSIS — O99213 Obesity complicating pregnancy, third trimester: Secondary | ICD-10-CM | POA: Insufficient documentation

## 2021-07-11 DIAGNOSIS — Z3483 Encounter for supervision of other normal pregnancy, third trimester: Secondary | ICD-10-CM | POA: Insufficient documentation

## 2021-07-11 DIAGNOSIS — R638 Other symptoms and signs concerning food and fluid intake: Secondary | ICD-10-CM | POA: Insufficient documentation

## 2021-07-11 DIAGNOSIS — D573 Sickle-cell trait: Secondary | ICD-10-CM | POA: Diagnosis not present

## 2021-07-13 ENCOUNTER — Ambulatory Visit (INDEPENDENT_AMBULATORY_CARE_PROVIDER_SITE_OTHER): Payer: Medicaid Other | Admitting: Obstetrics and Gynecology

## 2021-07-13 ENCOUNTER — Other Ambulatory Visit: Payer: Self-pay

## 2021-07-13 VITALS — BP 90/66 | HR 90 | Wt 225.0 lb

## 2021-07-13 DIAGNOSIS — Z3483 Encounter for supervision of other normal pregnancy, third trimester: Secondary | ICD-10-CM

## 2021-07-13 DIAGNOSIS — Z641 Problems related to multiparity: Secondary | ICD-10-CM

## 2021-07-13 DIAGNOSIS — Z789 Other specified health status: Secondary | ICD-10-CM

## 2021-07-14 ENCOUNTER — Telehealth (HOSPITAL_COMMUNITY): Payer: Self-pay | Admitting: *Deleted

## 2021-07-14 NOTE — Telephone Encounter (Signed)
Preadmission screen ?[phone] interpreter number ?

## 2021-07-15 ENCOUNTER — Other Ambulatory Visit: Payer: Self-pay

## 2021-07-15 ENCOUNTER — Inpatient Hospital Stay (HOSPITAL_COMMUNITY)
Admission: AD | Admit: 2021-07-15 | Discharge: 2021-07-17 | DRG: 807 | Disposition: A | Discharge status: Home / Self Care | Payer: Medicaid Other | Attending: Obstetrics and Gynecology | Admitting: Obstetrics and Gynecology

## 2021-07-15 ENCOUNTER — Encounter (HOSPITAL_COMMUNITY): Payer: Self-pay | Admitting: Obstetrics and Gynecology

## 2021-07-15 DIAGNOSIS — D573 Sickle-cell trait: Secondary | ICD-10-CM | POA: Diagnosis present

## 2021-07-15 DIAGNOSIS — O99824 Streptococcus B carrier state complicating childbirth: Secondary | ICD-10-CM | POA: Diagnosis present

## 2021-07-15 DIAGNOSIS — Z641 Problems related to multiparity: Secondary | ICD-10-CM

## 2021-07-15 DIAGNOSIS — O9902 Anemia complicating childbirth: Secondary | ICD-10-CM | POA: Diagnosis not present

## 2021-07-15 DIAGNOSIS — Z789 Other specified health status: Secondary | ICD-10-CM | POA: Diagnosis present

## 2021-07-15 DIAGNOSIS — O26893 Other specified pregnancy related conditions, third trimester: Secondary | ICD-10-CM | POA: Diagnosis not present

## 2021-07-15 DIAGNOSIS — O326XX0 Maternal care for compound presentation, not applicable or unspecified: Secondary | ICD-10-CM | POA: Diagnosis not present

## 2021-07-15 DIAGNOSIS — Z3A39 39 weeks gestation of pregnancy: Secondary | ICD-10-CM

## 2021-07-15 DIAGNOSIS — Z3490 Encounter for supervision of normal pregnancy, unspecified, unspecified trimester: Secondary | ICD-10-CM

## 2021-07-15 DIAGNOSIS — O99820 Streptococcus B carrier state complicating pregnancy: Secondary | ICD-10-CM

## 2021-07-15 LAB — CBC
HCT: 39 % (ref 36.0–46.0)
Hemoglobin: 13.1 g/dL (ref 12.0–15.0)
MCH: 24.9 pg — ABNORMAL LOW (ref 26.0–34.0)
MCHC: 33.6 g/dL (ref 30.0–36.0)
MCV: 74.1 fL — ABNORMAL LOW (ref 80.0–100.0)
Platelets: 283 10*3/uL (ref 150–400)
RBC: 5.26 MIL/uL — ABNORMAL HIGH (ref 3.87–5.11)
RDW: 15 % (ref 11.5–15.5)
WBC: 10.7 10*3/uL — ABNORMAL HIGH (ref 4.0–10.5)
nRBC: 0 % (ref 0.0–0.2)

## 2021-07-15 LAB — TYPE AND SCREEN
ABO/RH(D): A POS
Antibody Screen: NEGATIVE

## 2021-07-15 MED ORDER — LACTATED RINGERS IV SOLN: INTRAVENOUS | Status: DC

## 2021-07-15 MED ORDER — OXYTOCIN-SODIUM CHLORIDE 30-0.9 UT/500ML-% IV SOLN
2.5000 [IU]/h | INTRAVENOUS | Status: DC
  Filled 2021-07-15: qty 500

## 2021-07-15 MED ORDER — LACTATED RINGERS IV SOLN: 500.0000 mL | INTRAVENOUS | Status: DC | PRN

## 2021-07-15 MED ORDER — ONDANSETRON HCL 4 MG/2ML IJ SOLN: 4.0000 mg | Freq: Four times a day (QID) | INTRAMUSCULAR | Status: DC | PRN

## 2021-07-15 MED ORDER — LIDOCAINE HCL (PF) 1 % IJ SOLN
30.0000 mL | INTRAMUSCULAR | Status: AC | PRN
  Administered 2021-07-16: 30 mL via SUBCUTANEOUS
  Filled 2021-07-15: qty 30

## 2021-07-15 MED ORDER — SODIUM CHLORIDE 0.9 % IV SOLN
2.0000 g | Freq: Once | INTRAVENOUS | Status: AC
  Administered 2021-07-15: 2 g via INTRAVENOUS
  Filled 2021-07-15: qty 2000

## 2021-07-15 MED ORDER — SOD CITRATE-CITRIC ACID 500-334 MG/5ML PO SOLN: 30.0000 mL | ORAL | Status: DC | PRN

## 2021-07-15 MED ORDER — TRANEXAMIC ACID-NACL 1000-0.7 MG/100ML-% IV SOLN
1000.0000 mg | Freq: Once | INTRAVENOUS | Status: AC
  Administered 2021-07-16: 1000 mg via INTRAVENOUS

## 2021-07-15 MED ORDER — FENTANYL CITRATE (PF) 100 MCG/2ML IJ SOLN: 50.0000 ug | INTRAMUSCULAR | Status: DC | PRN

## 2021-07-15 MED ORDER — ACETAMINOPHEN 325 MG PO TABS: 650.0000 mg | ORAL_TABLET | ORAL | Status: DC | PRN

## 2021-07-15 MED ORDER — SODIUM CHLORIDE 0.9 % IV SOLN
1.0000 g | INTRAVENOUS | Status: DC
  Administered 2021-07-16 (×2): 1 g via INTRAVENOUS
  Filled 2021-07-15 (×2): qty 1000

## 2021-07-15 MED ORDER — OXYTOCIN BOLUS FROM INFUSION
333.0000 mL | Freq: Once | INTRAVENOUS | Status: AC
  Administered 2021-07-16: 333 mL via INTRAVENOUS

## 2021-07-15 NOTE — H&P (Signed)
Gina Walton is a 36 y.o. female, G8P7007 at 39.2 weeks,  presenting for active labor. Patient receives care at [facility] and was supervised for a low risk pregnancy. Pregnancy and medical history significant for problems as listed below. She is GBS positive and expresses a desire for no interventions for pain management.  She is anticipating a Female infant and requests Outpt IUD for PP birth control method.   ? ?Patient Active Problem List  ? Diagnosis Date Noted  ? Indication for care in labor and delivery, antepartum 07/15/2021  ? Group B streptococcal carriage complicating pregnancy [id number]  ? Supervision of normal pregnancy 03/14/2021  ? Language barrier 03/16/2020  ? [place] multipara 12/08/2019  ? Sickle cell trait ([place]) 10/13/2019  ? ? ?History of present pregnancy: ? ?Last evaluation:  July 06, 2021 in office with Dr. Little Ishikawa ?BP: 112/76  ?Pulse: 93  ?Weight: 224 lb 3.2 oz (101.7 kg)  ?  ? ?Nursing Staff Provider  ?Office Location  [facility] Dating  U/s   ?Language  English Anatomy US  normal  ?Flu Vaccine  03/22/21 Genetic/Carrier Screen  NIPS: low risk female     ?[id number]  ?[place]: carrier [place] disease  ?TDaP Vaccine   05/10/2021 Hgb A1C or  ?GTT Early  ?Third trimester - Normal   ?COVID Vaccine Not vaccinated   LAB RESULTS   ?Rhogam   Blood Type A/Positive/-- (11/30 1417)   ?Baby Feeding Plan Breast Antibody Negative (11/30 1417)  ?Contraception Yes, undecided  Rubella 12.70 (11/30 1417)  ?Circumcision Yes if a boy RPR Non Reactive (01/18 1100)   ?Pediatrician  Undecided 2/1 HBsAg Negative (11/30 1417)   ?Support Person Husband HCVAb   ?Prenatal Classes no HIV Non Reactive (01/18 1100)     ?BTL Consent  GBS Positive/-- (03/02 1104) ?(For PCN allergy, check sensitivities)   ?VBAC Consent  Pap Negative 07/11/20  ?     ?DME Rx Valu.Nieves ] BP cuff ?[ X] Weight Scale Waterbirth  [ ]  Class [ ]  Consent [ ]  CNM visit  ?PHQ9 & GAD7 Valu.Nieves  ] new OB ?[X]  28 weeks  ?[x ] 36 weeks Induction  [ ]  Orders Entered [ ] Foley  Y/N  ? ? ?OB History   ? ? Gravida  ?8  ? Para  ?7  ? Term  ?7  ? Preterm  ?   ? AB  ?   ? Living  ?7  ?  ? ? SAB  ?   ? IAB  ?   ? Ectopic  ?   ? Multiple  ?0  ? Live Births  ?7  ?   ?  ?  ? ? ? ? ?Past Medical History:  ?Diagnosis Date  ? Medical history non-contributory   ? ?Past Surgical History:  ?Procedure Laterality Date  ? NO PAST SURGERIES    ? ?Family History: family history is not on file. ?Social History:  reports that she has never smoked. She has never used smokeless tobacco. She reports that she does not drink alcohol and does not use drugs. ? ? ?Prenatal Transfer Tool  ?Maternal Diabetes: No ?Genetic Screening: Normal ?Maternal Ultrasounds/Referrals: Normal ?Fetal Ultrasounds or other Referrals:  None ?Maternal Substance Abuse:  No ?Significant Maternal Medications:  None ?Significant Maternal Lab Results: Group B Strep positive ? ? ?Maternal Assessment: ? ?ROS: +Contractions, -LOF, -Vaginal Bleeding, ?+Fetal Movement ? ?All other systems reviewed and negative.  ? ? ?No Known Allergies ? ? ?Dilation: 5 ?Effacement (%): 80 ?Station: -2 ?Exam  by:: Linton Rump, RN ?Blood pressure 119/66, pulse (!) 128, temperature 99.1 ?F (37.3 ?C), temperature source Oral, resp. rate 20, weight 102.1 kg, last menstrual period 10/05/2020, SpO2 100 %, unknown if currently breastfeeding. ? ?Physical Exam ?Vitals reviewed.  ?Constitutional:   ?   Appearance: She is well-developed.  ?HENT:  ?   Head: Normocephalic and atraumatic.  ?Eyes:  ?   Conjunctiva/sclera: Conjunctivae normal.  ?Cardiovascular:  ?   Rate and Rhythm: Normal rate and regular rhythm.  ?   Heart sounds: Normal heart sounds.  ?Pulmonary:  ?   Effort: Pulmonary effort is normal. No respiratory distress.  ?   Breath sounds: Normal breath sounds.  ?Abdominal:  ?   General: Bowel sounds are normal.  ?Musculoskeletal:     ?   General: Normal range of motion.  ?   Cervical back: Normal range of motion.  ?   Right lower leg: No edema.  ?   Left lower leg: No  edema.  ?Skin: ?   General: Skin is warm and dry.  ?Neurological:  ?   Mental Status: She is alert and oriented to person, place, and time.  ?Psychiatric:     ?   Mood and Affect: Mood normal.     ?   Behavior: Behavior normal.  ? ? ?Fetal Assessment: ?[place]: ?-Pelvis: Adequate and Proven ?-EFW: 8lbs ?-Presentation: Vertex per Nurse exam ? ?FHR: 140 bpm, Mod Var, -Decels, +Accels ?UCs:  Q2-46min, Palpates moderate ? ?  ?Assessment ?IUP at 39.2 weeks ?Cat I FT ?Active Labor ?Swahili Speaking ?Grand Multip ?GBS Positive ? ?Plan: ?Admit to [facility]  ?Routine Labor and Delivery Orders per Protocol ?In room to complete assessment and discuss POC: ?-Ampicillin ordered for GBS prophylaxis. ?-TXA ordered for bleeding prophylaxis.  ?Patient and husband without questions or concerns. ?Anticipating female infant ?Desires to both breast and bottle feed.  ?-Interpretations completed with assistance of phone interpreter. ?  ? ?Maryann Conners ?07/15/2021, 8:16 PM ? ? ? ? ?

## 2021-07-15 NOTE — MAU Note (Signed)
Gina Walton is a 36 y.o. at [redacted]w[redacted]d here in MAU reporting: contractions unsure of frequency. Denies SROM, vaginal bleeding or bloody show. Endorses + fetal movement. ? ?Onset of complaint: this am- ?Pain score:  ?Vitals:  ? 07/15/21 1958  ?BP: (!) 116/58  ?Pulse: (!) 132  ?Resp: 20  ?Temp: 98.1 ?F (36.7 ?C)  ?SpO2: 100%  ?   ?[id number]   ?Lab orders placed from triage:  mau labor ?

## 2021-07-16 ENCOUNTER — Encounter (HOSPITAL_COMMUNITY): Payer: Self-pay | Admitting: Obstetrics and Gynecology

## 2021-07-16 DIAGNOSIS — Z3A39 39 weeks gestation of pregnancy: Secondary | ICD-10-CM | POA: Diagnosis not present

## 2021-07-16 DIAGNOSIS — O99820 Streptococcus B carrier state complicating pregnancy: Secondary | ICD-10-CM | POA: Diagnosis not present

## 2021-07-16 DIAGNOSIS — O326XX0 Maternal care for compound presentation, not applicable or unspecified: Secondary | ICD-10-CM | POA: Diagnosis not present

## 2021-07-16 LAB — RPR: RPR Ser Ql: NONREACTIVE

## 2021-07-16 MED ORDER — IBUPROFEN 600 MG PO TABS
600.0000 mg | ORAL_TABLET | Freq: Four times a day (QID) | ORAL | Status: DC
  Administered 2021-07-16 – 2021-07-17 (×5): 600 mg via ORAL
  Filled 2021-07-16 (×5): qty 1

## 2021-07-16 MED ORDER — DIBUCAINE (PERIANAL) 1 % EX OINT: 1.0000 "application " | TOPICAL_OINTMENT | CUTANEOUS | Status: DC | PRN

## 2021-07-16 MED ORDER — SENNOSIDES-DOCUSATE SODIUM 8.6-50 MG PO TABS
2.0000 | ORAL_TABLET | ORAL | Status: DC
  Administered 2021-07-16 – 2021-07-17 (×2): 2 via ORAL
  Filled 2021-07-16 (×2): qty 2

## 2021-07-16 MED ORDER — WITCH HAZEL-GLYCERIN EX PADS: 1.0000 "application " | MEDICATED_PAD | CUTANEOUS | Status: DC | PRN

## 2021-07-16 MED ORDER — TRANEXAMIC ACID-NACL 1000-0.7 MG/100ML-% IV SOLN: 1000.0000 mg | INTRAVENOUS | Status: DC

## 2021-07-16 MED ORDER — ONDANSETRON HCL 4 MG PO TABS: 4.0000 mg | ORAL_TABLET | ORAL | Status: DC | PRN

## 2021-07-16 MED ORDER — ZOLPIDEM TARTRATE 5 MG PO TABS: 5.0000 mg | ORAL_TABLET | Freq: Every evening | ORAL | Status: DC | PRN

## 2021-07-16 MED ORDER — COCONUT OIL OIL: 1.0000 "application " | TOPICAL_OIL | Status: DC | PRN

## 2021-07-16 MED ORDER — DIPHENHYDRAMINE HCL 25 MG PO CAPS: 25.0000 mg | ORAL_CAPSULE | Freq: Four times a day (QID) | ORAL | Status: DC | PRN

## 2021-07-16 MED ORDER — ONDANSETRON HCL 4 MG/2ML IJ SOLN: 4.0000 mg | INTRAMUSCULAR | Status: DC | PRN

## 2021-07-16 MED ORDER — TRANEXAMIC ACID-NACL 1000-0.7 MG/100ML-% IV SOLN
INTRAVENOUS | Status: AC
  Filled 2021-07-16: qty 100

## 2021-07-16 MED ORDER — TETANUS-DIPHTH-ACELL PERTUSSIS 5-2.5-18.5 LF-MCG/0.5 IM SUSY: 0.5000 mL | PREFILLED_SYRINGE | Freq: Once | INTRAMUSCULAR | Status: DC

## 2021-07-16 MED ORDER — PRENATAL MULTIVITAMIN CH
1.0000 | ORAL_TABLET | Freq: Every day | ORAL | Status: DC
  Administered 2021-07-16 – 2021-07-17 (×2): 1 via ORAL
  Filled 2021-07-16 (×2): qty 1

## 2021-07-16 MED ORDER — BENZOCAINE-MENTHOL 20-0.5 % EX AERO: 1.0000 "application " | INHALATION_SPRAY | CUTANEOUS | Status: DC | PRN

## 2021-07-16 MED ORDER — ACETAMINOPHEN 325 MG PO TABS
650.0000 mg | ORAL_TABLET | ORAL | Status: DC | PRN
  Administered 2021-07-16 – 2021-07-17 (×3): 650 mg via ORAL
  Filled 2021-07-16 (×3): qty 2

## 2021-07-16 MED ORDER — SIMETHICONE 80 MG PO CHEW: 80.0000 mg | CHEWABLE_TABLET | ORAL | Status: DC | PRN

## 2021-07-16 NOTE — Discharge Summary (Signed)
Postpartum Discharge Summary ?   ?Patient Name: Gina Walton ?DOB: 1985-09-13 ?MRN: [id number] ? ?Date of admission: 07/15/2021 ?Delivery date:07/16/2021  ?Delivering provider: EMLY, JESSICA  ?Date of discharge: 07/17/2021 ? ?Admitting diagnosis: Indication for care in labor and delivery, antepartum [O75.9] ?Intrauterine pregnancy: [redacted]w[redacted]d    ?Secondary diagnosis:  Principal Problem: ?  Vaginal delivery ?Active Problems: ?  GBettlesmultipara ?  Language barrier ?  Supervision of normal pregnancy ?  Group B streptococcal carriage complicating pregnancy ?  Indication for care in labor and delivery, antepartum ?  First degree perineal laceration ? ?Additional problems: None    ?Discharge diagnosis: Term Pregnancy Delivered                                              ?Post partum procedures: none ?Augmentation: AROM ?Complications: None ? ?Hospital course: Onset of Labor With Vaginal Delivery      ?36y.o. yo [id number] admitted in Active Labor on 07/15/2021. Patient had an uncomplicated labor course as follows:  ?Membrane Rupture Time/Date: 1:08 AM ,07/16/2021   ?Delivery Method:Vaginal, Spontaneous  ?Episiotomy: None  ?Lacerations:  1st degree;Perineal  ?Patient had an uncomplicated postpartum course.  She is ambulating, tolerating a regular diet, passing flatus, and urinating well. Patient is discharged home in stable condition on 07/17/21. ? ?Newborn Data: ?Birth date:07/16/2021  ?Birth time:7:47 AM  ?Gender:Female  ?Living status:Living  ?Apgars:8 ,9  ?[id number]  ? ?Magnesium Sulfate received: No ?BMZ received: No ?Rhophylac:N/A ?MMR:N/A ?T-DaP:Given prenatally ?Flu: Yes ?Transfusion:No ? ?Physical exam  ?Vitals:  ? 07/16/21 1537 07/16/21 1940 07/16/21 2300 07/17/21 0500  ?BP: (!) 99/57 107/74 118/77 (!) 100/57  ?Pulse: 71 81 66 83  ?Resp: _0 ?Temp: 98.2 ?F (36.8 ?C) 98.6 ?F (37 ?C) 98.2 ?F (36.8 ?C) 98.2 ?F (36.8 ?C)  ?TempSrc: Oral Oral Oral Oral  ?SpO2: 100% 100% 99% 100%  ?Weight:      ?Height:       ? ?General: alert, cooperative, and no distress ?Lochia: appropriate ?Uterine Fundus: firm ?Incision: N/A ?DVT Evaluation: No evidence of DVT seen on physical exam. ?Labs: ?Lab Results  ?Component Value Date  ? WBC 10.7 (H) 07/15/2021  ? HGB 13.1 07/15/2021  ? HCT 39.0 07/15/2021  ? MCV 74.1 (L) 07/15/2021  ? PLT 283 07/15/2021  ? ? ?  Latest Ref Rng & Units 12/19/2020  ?  7:29 AM  ?CMP  ?Glucose 70 - 99 mg/dL 98    ?BUN 6 - 20 mg/dL 8    ?Creatinine 0.44 - 1.00 mg/dL 0.80    ?Sodium 135 - 145 mmol/L 134    ?Potassium 3.5 - 5.1 mmol/L 3.6    ?Chloride 98 - 111 mmol/L 106    ?CO2 22 - 32 mmol/L 20    ?Calcium 8.9 - 10.3 mg/dL 9.2    ?Total Protein 6.5 - 8.1 g/dL 7.5    ?Total Bilirubin 0.3 - 1.2 mg/dL 0.5    ?Alkaline Phos 38 - 126 U/L 40    ?AST 15 - 41 U/L 21    ?ALT 0 - 44 U/L 22    ? ?Edinburgh Score: ? ?  07/17/2021  ?  8:39 AM  ?EdFlavia Shipperostnatal Depression Scale Screening Tool  ?I have been able to laugh and see the funny side of things. 0  ?I have looked forward with  enjoyment to things. 0  ?I have blamed myself unnecessarily when things went wrong. 0  ?I have been anxious or worried for no good reason. 0  ?I have felt scared or panicky for no good reason. 0  ?Things have been getting on top of me. 2  ?I have been so unhappy that I have had difficulty sleeping. 0  ?I have felt sad or miserable. 0  ?I have been so unhappy that I have been crying. 0  ?The thought of harming myself has occurred to me. 0  ?Edinburgh Postnatal Depression Scale Total 2  ? ? ? ?After visit meds:  ?Allergies as of 07/17/2021   ?No Known Allergies ?  ? ?  ?Medication List  ?  ? ?STOP taking these medications   ? ?cyclobenzaprine 5 MG tablet ?Commonly known as: FLEXERIL ?  ?polyethylene glycol 17 g packet ?Commonly known as: MIRALAX / GLYCOLAX ?Replaced by: polyethylene glycol powder 17 GM/SCOOP powder ?  ? ?  ? ?TAKE these medications   ? ?acetaminophen 325 MG tablet ?Commonly known as: Tylenol ?Take 2 tablets (650 mg total) by  mouth every 4 (four) hours as needed (for pain scale < 4). ?  ?Blood Pressure Kit Devi ?1 kit by Does not apply route once a week. ?  ?ibuprofen 600 MG tablet ?Commonly known as: ADVIL ?Take 1 tablet (600 mg total) by mouth every 6 (six) hours. ?  ?polyethylene glycol powder 17 GM/SCOOP powder ?Commonly known as: GLYCOLAX/MIRALAX ?Take 17 g by mouth daily as needed. ?Replaces: polyethylene glycol 17 g packet ?  ?Vitafol Ultra 29-0.6-0.4-200 MG Caps ?Take 1 capsule by mouth daily. ?  ? ?  ? ? ? ?Discharge home in stable condition ?Infant Feeding: Breast ?Infant Disposition:home with mother ?Discharge instruction: per After Visit Summary and Postpartum booklet. ?Activity: Advance as tolerated. Pelvic rest for 6 weeks.  ?Diet: routine diet ?Future Appointments: ?Future Appointments  ?Date Time Provider [place]  ?08/28/2021 10:55 AM Gavin Pound, CNM [facility] None  ? ?Follow up Visit: Message sent 07/16/2021 ? ? ?Please schedule this patient for a In person postpartum visit in 4 weeks with the following provider: Any provider. ?Additional Postpartum F/U: None   ?Low risk pregnancy complicated by:  None ?Delivery mode:  Vaginal, Spontaneous  ?Anticipated Birth Control:  Desires outpatient hormonal IUD ? ? ?07/17/2021 ?Clarnce Flock, MD ? ? ? ?

## 2021-07-16 NOTE — Progress Notes (Signed)
Patient ID: Gina Walton, female   DOB: 1986/04/14, 36 y.o.   MRN: [id number] ? ?Subjective: ?-Patient resting in bed. Coping well with contractions. ? ?Objective: ?BP 104/70   Pulse 96   Temp 98.3 ?F (36.8 ?C) (Oral)   Resp 16   Ht 5' (1.524 m)   Wt 102.1 kg   LMP 10/05/2020 Comment: Only spotting for 3 days. Patient was breastfeeding at the time  SpO2 100%   BMI 43.96 kg/m?  ?No intake/output data recorded. ?No intake/output data recorded. ? ?Fetal Monitoring: ?FHT: 140 bpm, Mod Var, + Early Decels, +Accels ?UC: Ctx palpate moderate   ? ?Vaginal Exam: ?SVE:   Dilation: 8 ?Effacement (%): 80 ?Station: -2 ?Exam by:: Gerrit Heck, CNM ?Membranes:AROM x  ?Internal Monitors: None ? ?Augmentation/Induction: ?Pitocin:None ?Cytotec: None ? ?Assessment:  ?IUP at 39.3 weeks ?Cat I FT  ?Expectant Mgmt ? ? ?Plan: ?-Anticipate SVD ?-Consider pitocin augmentation if no cervical change.  ?-Continue other mgmt as ordered ? ? ?Valma Cava, CNM ?Systems developer, Center for [employer] ?07/16/2021, 6:45 AM ? ? ?

## 2021-07-16 NOTE — Progress Notes (Signed)
Patient ID: Gina Walton, female   DOB: [birth year], 36 y.o.   MRN: [id number] ? ?Subjective: ?-Patient resting in bed.  Breathing through contractions.  ? ?Objective: ?BP 119/66   Pulse (!) 128   Temp 99.1 ?F (37.3 ?C) (Oral)   Resp 20   Ht 5' (1.524 m)   Wt 102.1 kg   LMP 10/05/2020 Comment: Only spotting for 3 days. Patient was breastfeeding at the time  SpO2 100%   BMI 43.96 kg/m?  ?No intake/output data recorded. ?No intake/output data recorded. ? ?Fetal Monitoring: ?FHT: 140 bpm, Mod Var, -Decels, +Accels ?UC: Palpates mild   ? ?Vaginal Exam: ?SVE:   Dilation: 6.5 ?Effacement (%): 80 ?Station: -2 ?Exam by:: Gavin Pound, CNM ?Membranes:AROM of small amt clear fluid ?Internal Monitors: None ? ?Augmentation/Induction: ?Pitocin:None ?Cytotec: None ? ?Assessment:  ?IUP at 39.3 weeks ?Cat I FT  ?Amniotomy ? ?Plan: ?-Discussed AROM r/b including increased risk of infection, cord prolapse, fetal intolerance, and decreased labor time. No questions or concerns and patient desires to proceed with AROM.  ?-Interpreter used for discussion and throughout procedure.  ?-Continue other mgmt as ordered ? ? ?Riley Churches, CNM ?Energy manager, Center for [employer] ?07/16/2021, 1:13 AM ? ? ?

## 2021-07-16 NOTE — Lactation Note (Addendum)
This note was copied from a baby's chart. ?Lactation Consultation Note ? ?Patient Name: Gina Walton ?Today's Date: 07/16/2021 ?Reason for consult: Initial assessment ?Age:36 ? ?Swahili interpreter used via Ipad.  ?P8, Ex BF.  Reviewed hand expression with drops expressed.  Mother is doubtful of her supply and plans to breast and formula feeding. ?Suggest offering breast before offering formula. ?Offered to assist with latching and mother declined and wanted to let baby sleep. ?Feed on demand with cues.  Goal 8-12+ times per day after first 24 hrs.   ?Mom made aware of O/P services, breastfeeding support groups,and our phone # for post-discharge questions.  ? ? ?Maternal Data ?Has patient been taught Hand Expression?: Yes ?Does the patient have breastfeeding experience prior to this delivery?: Yes ?How long did the patient breastfeed?: 1-2 years ? ?Feeding ?Mother's Current Feeding Choice: Breast Milk and Formula ? ?Interventions ?Interventions: Breast feeding basics reviewed;Hand express;Education;LC Services brochure ? ?Consult Status ?Consult Status: Follow-up ?Date: 07/17/21 ?Follow-up type: In-patient ? ? ? ?Dahlia Byes Boschen ?07/16/2021, 2:14 PM ? ? ? ?

## 2021-07-17 MED ORDER — ACETAMINOPHEN 325 MG PO TABS: 650.0000 mg | ORAL_TABLET | ORAL | 0 refills | Status: DC | PRN

## 2021-07-17 MED ORDER — POLYETHYLENE GLYCOL 3350 17 GM/SCOOP PO POWD: 17.0000 g | Freq: Every day | ORAL | 1 refills | Status: DC | PRN

## 2021-07-17 MED ORDER — IBUPROFEN 600 MG PO TABS: 600.0000 mg | ORAL_TABLET | Freq: Four times a day (QID) | ORAL | 0 refills | Status: DC

## 2021-07-17 NOTE — Progress Notes (Signed)
? ?  PRENATAL VISIT NOTE ? ?Subjective:  ?Gina Walton is a 36 y.o. [id number] at [redacted]w[redacted]d being seen today for ongoing prenatal care.  She is currently monitored for the following issues for this high-risk pregnancy and has Sickle cell trait ([place]); [employer] multipara; Language barrier; Vaginal delivery; Supervision of normal pregnancy; Group B streptococcal carriage complicating pregnancy; Indication for care in labor and delivery, antepartum; and First degree perineal laceration on their problem list. ? ?Patient reports no complaints.  Contractions: Irregular. Vag. Bleeding: None.  Movement: Present. Denies leaking of fluid.  ? ?The following portions of the patient's history were reviewed and updated as appropriate: allergies, current medications, past family history, past medical history, past social history, past surgical history and problem list.  ? ?Objective:  ? ?Vitals:  ? 07/13/21 0944  ?BP: 90/66  ?Pulse: 90  ?Weight: 225 lb (102.1 kg)  ? ? ?Fetal Status: Fetal Heart Rate (bpm): 145   Movement: Present    ? ?General:  Alert, oriented and cooperative. Patient is in no acute distress.  ?Skin: Skin is warm and dry. No rash noted.   ?Cardiovascular: Normal heart rate noted  ?Respiratory: Normal respiratory effort, no problems with respiration noted  ?Abdomen: Soft, gravid, appropriate for gestational age.  Pain/Pressure: Present     ?Pelvic: Cervical exam deferred        ?Extremities: Normal range of motion.  Edema: None  ?Mental Status: Normal mood and affect. Normal behavior. Normal judgment and thought content.  ? ?Assessment and Plan:  ?Pregnancy: [id number] at [redacted]w[redacted]d ?1. Encounter for supervision of other normal pregnancy in third trimester ?Patient set up for PDIOL on 4/2. GBS+ ? ?2. Language barrier ?Interpreter used ? ?3. [place] multipara ?Recommend consideration for methergine and/or txa after delivery ? ?Term labor symptoms and general obstetric precautions including but not limited to vaginal bleeding,  contractions, leaking of fluid and fetal movement were reviewed in detail with the patient. ?Please refer to After Visit Summary for other counseling recommendations.  ? ?Return in about 1 week (around 07/20/2021) for low risk ob, in person, md or app. ? ?No future appointments. ? ?Aletha Halim, MD ? ?

## 2021-07-18 ENCOUNTER — Telehealth: Payer: Self-pay

## 2021-07-18 ENCOUNTER — Ambulatory Visit: Payer: Medicaid Other

## 2021-07-18 NOTE — Telephone Encounter (Signed)
Transition Care Management Unsuccessful Follow-up Telephone Call ? ?Date of discharge and from where:  07/17/2021-[facility]  ? ?Attempts:  1st Attempt ? ?Reason for unsuccessful TCM follow-up call:  Missing or invalid number ? ?  ?

## 2021-07-19 NOTE — Telephone Encounter (Signed)
Transition Care Management Unsuccessful Follow-up Telephone Call ? ?Date of discharge and from where:  07/17/2021-[facility] ? ?Attempts:  2nd Attempt ? ?Reason for unsuccessful TCM follow-up call:  Missing or invalid number ? ?  ?

## 2021-07-21 ENCOUNTER — Encounter: Payer: Medicaid Other | Admitting: Obstetrics

## 2021-07-23 ENCOUNTER — Inpatient Hospital Stay (HOSPITAL_COMMUNITY): Admission: AD | Admit: 2021-07-23 | Payer: Medicaid Other | Source: Home / Self Care | Admitting: Family Medicine

## 2021-07-23 ENCOUNTER — Inpatient Hospital Stay (HOSPITAL_COMMUNITY): Payer: Medicaid Other

## 2021-07-26 ENCOUNTER — Telehealth (HOSPITAL_COMMUNITY): Payer: Self-pay | Admitting: *Deleted

## 2021-07-26 NOTE — Telephone Encounter (Signed)
With help of telephonic interpreter "Freida Busman [phone]", attempted Hospital Discharge Follow-Up Call.  Voice mail box is full.  Unable to leave a message.  ?

## 2021-08-28 ENCOUNTER — Ambulatory Visit (INDEPENDENT_AMBULATORY_CARE_PROVIDER_SITE_OTHER): Payer: Medicaid Other

## 2021-08-28 DIAGNOSIS — Z30430 Encounter for insertion of intrauterine contraceptive device: Secondary | ICD-10-CM | POA: Diagnosis not present

## 2021-08-28 MED ORDER — PARAGARD INTRAUTERINE COPPER IU IUD
1.0000 | INTRAUTERINE_SYSTEM | Freq: Once | INTRAUTERINE | Status: AC
  Administered 2021-08-28: 1 via INTRAUTERINE

## 2021-08-28 NOTE — Progress Notes (Signed)
? ? ?Post Partum Visit Note ? ?Gina Walton is a 36 y.o. [phone] female who presents for a postpartum visit. She is 6 weeks postpartum following a normal spontaneous vaginal delivery.  I have fully reviewed the prenatal and intrapartum course. The delivery was at 39.3 gestational weeks.  Anesthesia: local. Postpartum course has been uncomplicated. Baby is doing well. Baby is feeding by breast. Bleeding no bleeding. Bowel function is normal. Bladder function is normal. Patient is not sexually active. Contraception method is none. Postpartum depression screening: negative. ? ?Patient reports feelings well, no depression.  She reports receiving infant support/assistance from her husband.  She endorses good sleep. She is breastfeeding without issues. Desires IUD today.  ? ?The pregnancy intention screening data noted above was reviewed. Potential methods of contraception were discussed. The patient elected to proceed with No data recorded. ? ? ? ?Health Maintenance Due  ?Topic Date Due  ? COVID-19 Vaccine (1) Never done  ? PAP SMEAR-Modifier  07/11/2021  ? ? ?The following portions of the patient's history were reviewed and updated as appropriate: allergies, current medications, past family history, past medical history, past social history, past surgical history, and problem list. ? ?Review of Systems ?Pertinent items noted in HPI and remainder of comprehensive ROS otherwise negative. ? ?Objective:  ?Ht 5\' 1"  (1.549 m)   Wt 224 lb (101.6 kg)   LMP 10/05/2020 Comment: Only spotting for 3 days. Patient was breastfeeding at the time  Breastfeeding Yes   BMI 42.32 kg/m?   ? ?General:  alert, cooperative, and no distress  ? Breasts:  Not Assessed  ?Lungs: clear to auscultation bilaterally  ?Heart:  regular rate and rhythm  ?Abdomen: soft, non-tender; bowel sounds normal; no masses,  no organomegaly   ?Wound  Laceration well healed.   ?GU exam:  normal  ?     ?Assessment:  ? ?36 year old 32 ?6 week postpartum  exam.  ?S/P SVD ?Breastfeeding ?Desires IUD ? ?Plan:  ? ?-Doing well. ?-Okay to resume normal activities. ?-IUD placed for contraception. See separate note. ? ? ?Essential components of care per ACOG recommendations: ? ?1.  Mood and well being: Patient with negative depression screening today. Reviewed local resources for support.  ?- Patient tobacco use? No.   ?- hx of drug use? No.   ? ?2. Infant care and feeding:  ?-Patient currently breastmilk feeding? Yes. Reviewed importance of draining breast regularly to support lactation.  ?-Social determinants of health (SDOH) reviewed in EPIC. No concerns. ? ?3. Sexuality, contraception and birth spacing ?- Patient does not want a pregnancy in the next year.  Desired family size is 8 children.  ?- Reviewed forms of contraception in tiered fashion. Patient desired IUD today.   ?- Discussed birth spacing of 18 months ? ?4. Sleep and fatigue ?-Encouraged family/partner/community support of 4 hrs of uninterrupted sleep to help with mood and fatigue ? ?5. Physical Recovery  ?- Discussed patients delivery and complications. She describes her labor as good. ?- Patient had a Vaginal, no problems at delivery. Patient had a 1st degree laceration. Perineal healing reviewed. Patient expressed understanding ?- Patient has urinary incontinence? No. ?- Patient is safe to resume physical and sexual activity ? ?6.  Health Maintenance ?- HM due items addressed Yes ?- Last pap smear  ?Diagnosis  ?Date Value Ref Range Status  ?07/11/2020   Final  ? - Negative for intraepithelial lesion or malignancy (NILM)  ? Pap smear not done at today's visit.  ?-Breast Cancer screening  indicated? No.  ? ?7. Chronic Disease/Pregnancy Condition follow up: Elevated BP in office today.  ? ?- Instructed to follow up with PCP ? ?Cherre Robins, CNM ?Center for [employer], [facility]  ?

## 2021-08-28 NOTE — Progress Notes (Signed)
? ? ?  GYNECOLOGY OFFICE PROCEDURE NOTE ? ?Gina Walton is a 36 y.o. [id number] here for Paragard IUD insertion. No GYN concerns.  Last pap smear was on March 2022 and was normal. ? ?IUD Insertion Procedure Note ?IUD: Paragard  Exp: Jan 2029  Lot: [id number] ? ?Patient identified, informed consent performed, consent signed.   Discussed risks of irregular bleeding, cramping, infection, malpositioning or misplacement of the IUD outside the uterus which may require further procedure such as laparoscopy. Time out was performed.  ? ?Bimanual exam performed and uterus of normal size, non-tender, and anteverted position. Speculum placed in the vagina and cervix visualized.  Cervix and vaginal walls cleaned x 3 with betadine solution. Anterior aspect grasped with a single tooth tenaculum.  Uterus sounded to 8 cm.  Paragard IUD placed per manufacturer's recommendations.  Strings trimmed to ~3 cm. Tenaculum was removed, good hemostasis noted.  Patient tolerated procedure well.  ? ?Patient was given post-procedure instructions.  She was advised to have backup contraception for one week.  Patient was instructed to check IUD strings after menses. Patient also instructed to follow up in 4 weeks for IUD check and call and/or report any issues prior to next visit. ? ?Maryann Conners, CNM ?08/28/2021  ? ?Interpreter present and utilized throughout procedure. ? ?Chaperone: Mickey Farber ? ?

## 2021-09-25 ENCOUNTER — Ambulatory Visit: Payer: Medicaid Other | Admitting: Obstetrics and Gynecology

## 2022-08-16 IMAGING — US US MFM FETAL BPP W/O NON-STRESS
1 series · 14 of 28 positions shown · non-contrast
Comparison: none

[Series 1: us mfm fetal bpp w/o non-stress · 36 acquisitions, 14 frames shown]
[im 2/36]
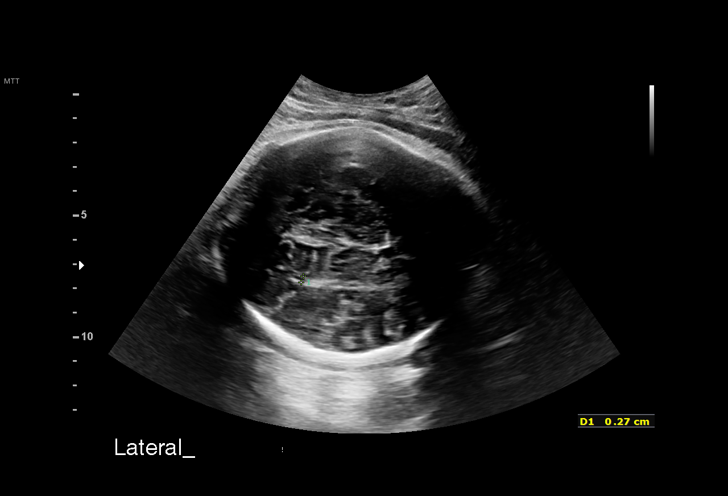
[im 4/36]
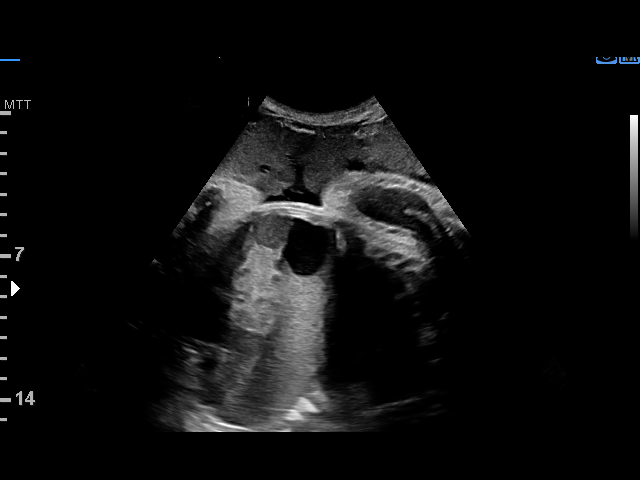
[im 7/36]
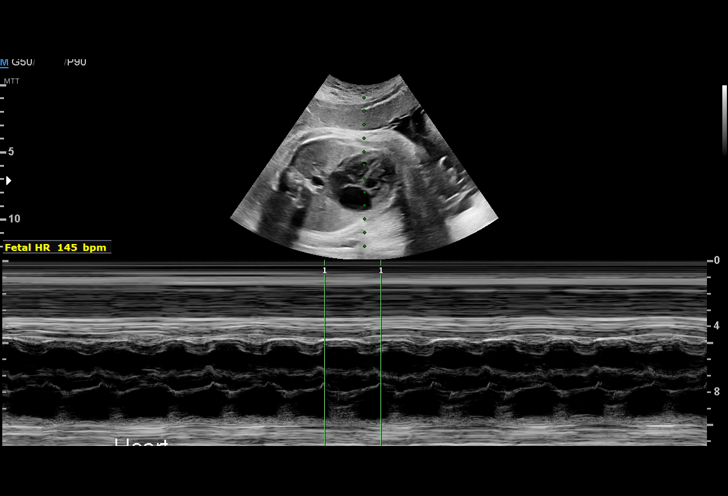
[im 10/36]
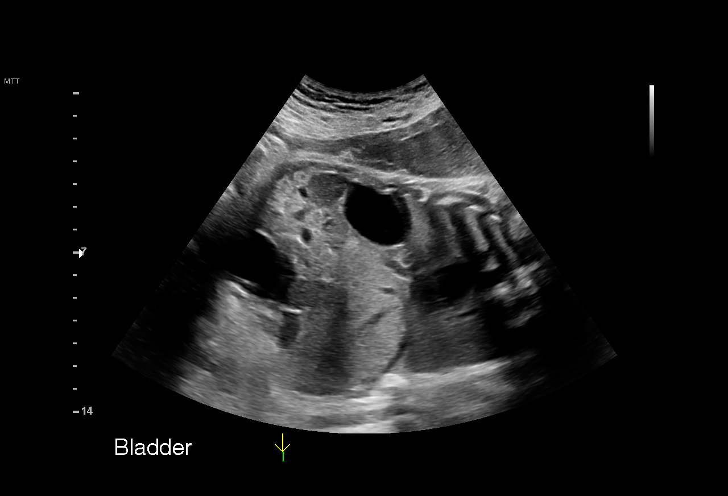
[im 12/36]
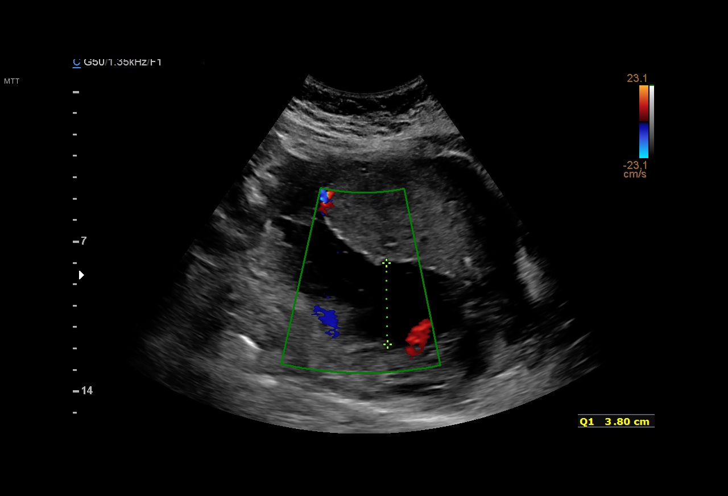
[im 15/36]
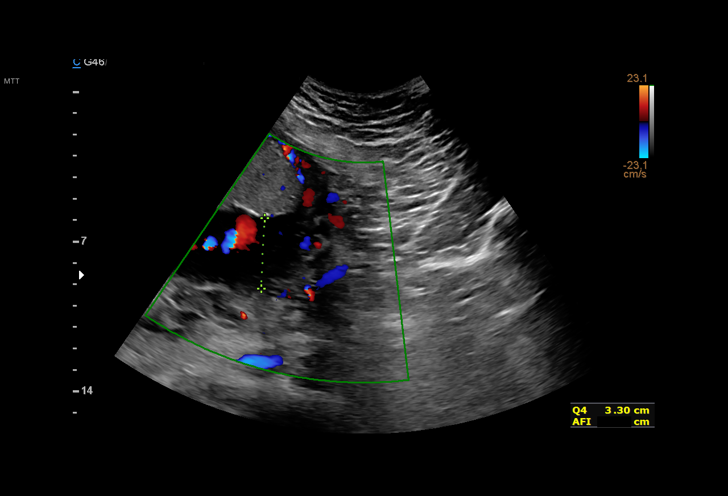
[im 17/36]
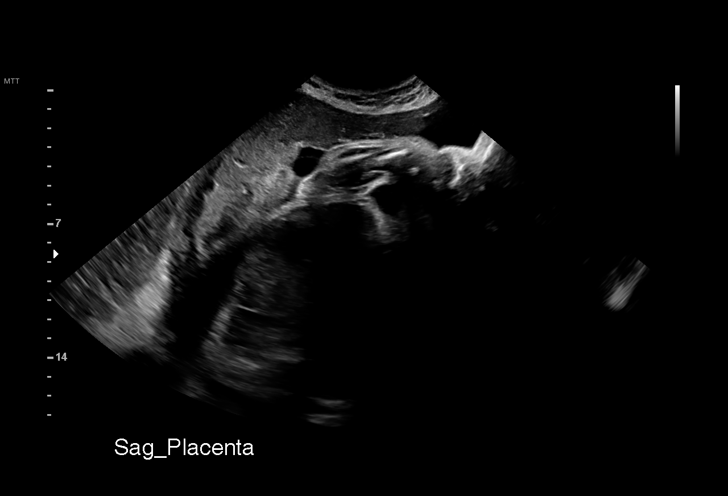
[im 20/36]
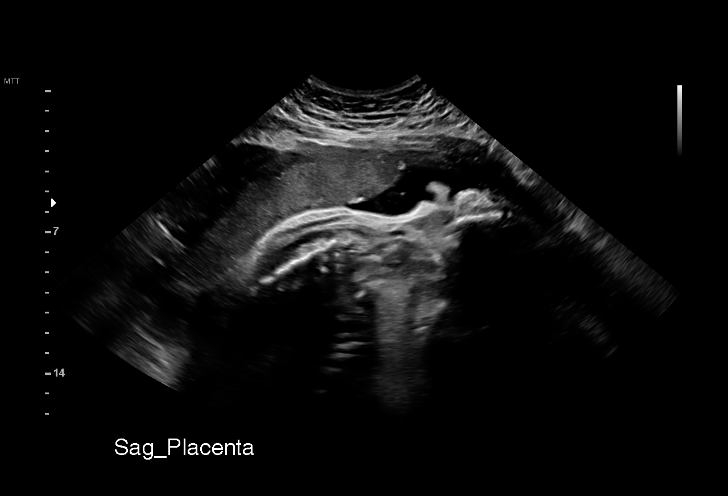
[im 23/36]
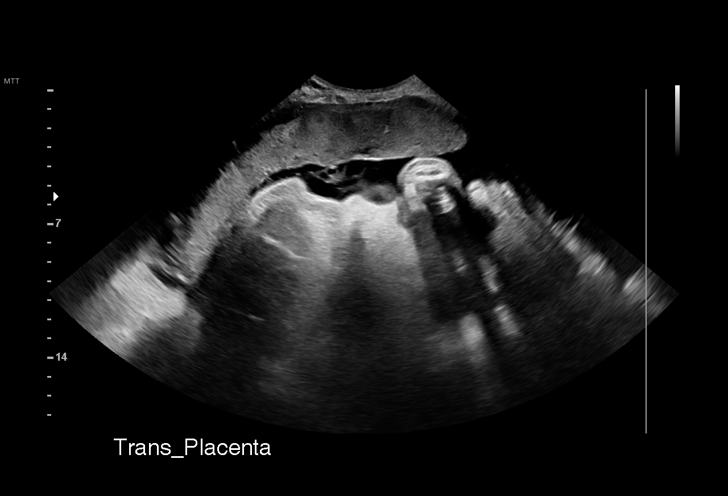
[im 25/36]
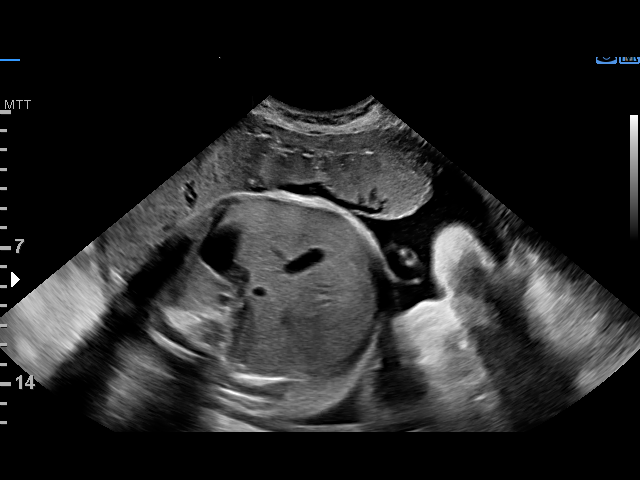
[im 28/36]
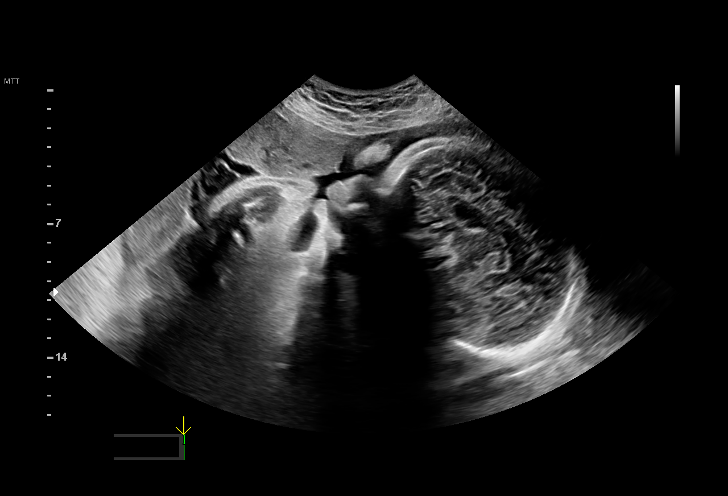
[im 30/36]
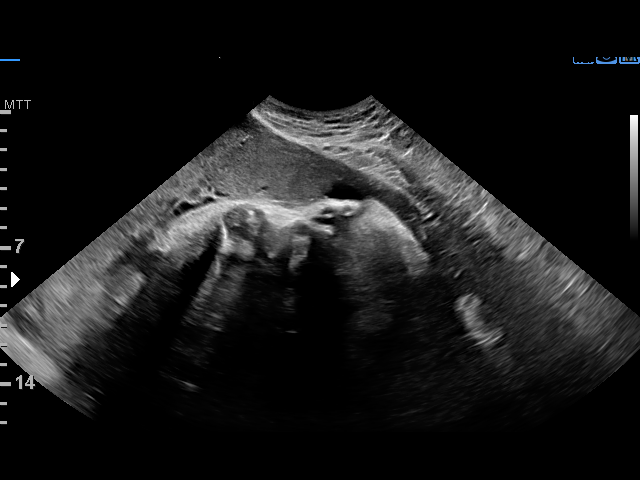
[im 33/36]
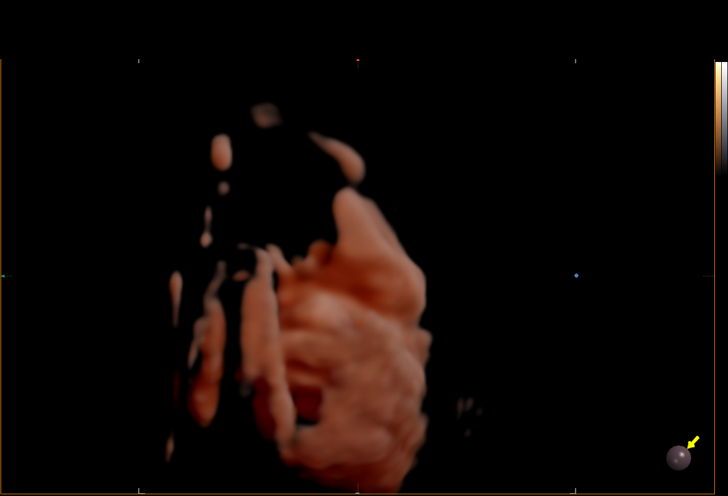
[im 36/36]
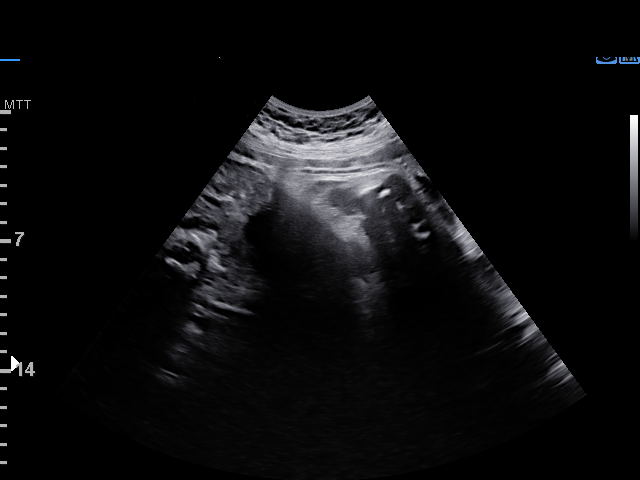

[14 of 28 positions shown; findings below may reference images not displayed]

Indications

 38 weeks gestation of pregnancy
 Obesity complicating pregnancy, third
 trimester (pregravid BMI 43)
 [place] multiparity, antepartum
 Genetic carrier (Sickle Cell Trait)
 LR NIPS/Negative AFP
 Encounter for other antenatal screening
 follow-up
Fetal Evaluation

 Num Of Fetuses:         1
 Fetal Heart Rate(bpm):  145
 Cardiac Activity:       Observed
 Presentation:           Cephalic
 Placenta:               Anterior
 P. Cord Insertion:      Previously Visualized

 Amniotic Fluid
 AFI FV:      Within normal limits

 AFI Sum(cm)     %Tile       Largest Pocket(cm)
 16.6            67

 RUQ(cm)       RLQ(cm)       LUQ(cm)        LLQ(cm)

Biophysical Evaluation

 Amniotic F.V:   Pocket => 2 cm             F. Tone:        Observed
 F. Movement:    Observed                   Score:          [DATE]
 F. Breathing:   Observed
OB History

 Blood Type:   A+
 Gravidity:    8         Term:   7
 Living:       7
Gestational Age

 LMP:           39w 6d        Date:  10/05/20                 EDD:   07/12/21
 Best:          38w 5d     Det. By:  U/S  (03/14/21)          EDD:   07/20/21
Anatomy

 Cranium:               Previously seen        LVOT:                   Previously seen
 Cavum:                 Previously seen        Aortic Arch:            Previously seen
 Ventricles:            Previously seen        Ductal Arch:            Previously seen
 Choroid Plexus:        Previously seen        Diaphragm:              Previously seen
 Cerebellum:            Previously seen        Stomach:                Previously Seen
 Posterior Fossa:       Previously seen        Abdomen:                Previously seen
 Nuchal Fold:           Not applicable (>20    Abdominal Wall:         Previously seen
                        wks GA)
 Face:                  Orbits and profile     Cord Vessels:           Previously seen
                        previously seen
 Lips:                  Previously seen        Kidneys:                Previously seen
 Palate:                Previously seen        Bladder:                Previously seen
 Thoracic:              Previously seen        Spine:                  Previously seen
 Heart:                 Previously seen        Upper Extremities:      Previously seen
 RVOT:                  Previously seen        Lower Extremities:      Previously seen

 Other:  VC, 3VV, 3VTV, lenses, nasal bone, heels/feet and open hands/5th
         digits previously visualized. Technicallly difficult due to advanced GA
         and maternal habitus.
Cervix Uterus Adnexa

 Cervix
 Not visualized (advanced GA >56wks)

 Uterus
 No abnormality visualized.

 Right Ovary
 Within normal limits.

 Left Ovary
 Within normal limits.

 Cul De Sac
 No free fluid seen.
 Adnexa
 No abnormality visualized.
Impression

 Antenatal testing performed given maternal elevated BMI
 The biophysical profile was [DATE] with good fetal movement and
 amniotic fluid volume.
Recommendations

 Consider delivery at 39-40 weeks.

## 2022-08-23 ENCOUNTER — Ambulatory Visit (HOSPITAL_COMMUNITY)
Admission: EM | Admit: 2022-08-23 | Discharge: 2022-08-23 | Disposition: A | Discharge status: Home / Self Care | Payer: Medicaid Other | Attending: Internal Medicine | Admitting: Internal Medicine

## 2022-08-23 ENCOUNTER — Encounter (HOSPITAL_COMMUNITY): Payer: Self-pay

## 2022-08-23 DIAGNOSIS — R002 Palpitations: Secondary | ICD-10-CM

## 2022-08-23 LAB — COMPREHENSIVE METABOLIC PANEL
ALT: 19 U/L (ref 0–44)
AST: 16 U/L (ref 15–41)
Albumin: 4.2 g/dL (ref 3.5–5.0)
Alkaline Phosphatase: 54 U/L (ref 38–126)
Anion gap: 6 (ref 5–15)
BUN: 9 mg/dL (ref 6–20)
CO2: 25 mmol/L (ref 22–32)
Calcium: 9.7 mg/dL (ref 8.9–10.3)
Chloride: 104 mmol/L (ref 98–111)
Creatinine, Ser: 0.91 mg/dL (ref 0.44–1.00)
GFR, Estimated: >60 mL/min (ref >60)
Glucose, Bld: 96 mg/dL (ref 70–99)
Potassium: 3.9 mmol/L (ref 3.5–5.1)
Sodium: 135 mmol/L (ref 135–145)
Total Bilirubin: 0.9 mg/dL (ref 0.3–1.2)
Total Protein: 8.6 g/dL — ABNORMAL HIGH (ref 6.5–8.1)

## 2022-08-23 LAB — CBC WITH DIFFERENTIAL/PLATELET
Abs Immature Granulocytes: 0.01 10*3/uL (ref 0.00–0.07)
Basophils Absolute: 0 10*3/uL (ref 0.0–0.1)
Basophils Relative: 1 %
Eosinophils Absolute: 0.1 10*3/uL (ref 0.0–0.5)
Eosinophils Relative: 1 %
HCT: 44.1 % (ref 36.0–46.0)
Hemoglobin: 14.3 g/dL (ref 12.0–15.0)
Immature Granulocytes: 0 %
Lymphocytes Relative: 32 %
Lymphs Abs: 1.8 10*3/uL (ref 0.7–4.0)
MCH: 24.3 pg — ABNORMAL LOW (ref 26.0–34.0)
MCHC: 32.4 g/dL (ref 30.0–36.0)
MCV: 75 fL — ABNORMAL LOW (ref 80.0–100.0)
Monocytes Absolute: 0.3 10*3/uL (ref 0.1–1.0)
Monocytes Relative: 6 %
Neutro Abs: 3.4 10*3/uL (ref 1.7–7.7)
Neutrophils Relative %: 60 %
Platelets: 349 10*3/uL (ref 150–400)
RBC: 5.88 MIL/uL — ABNORMAL HIGH (ref 3.87–5.11)
RDW: 14.7 % (ref 11.5–15.5)
WBC: 5.5 10*3/uL (ref 4.0–10.5)
nRBC: 0 % (ref 0.0–0.2)

## 2022-08-23 LAB — TSH: TSH: 1.925 u[IU]/mL (ref 0.350–4.500)

## 2022-08-23 LAB — VITAMIN D 25 HYDROXY (VIT D DEFICIENCY, FRACTURES): Vit D, 25-Hydroxy: 21.72 ng/mL — ABNORMAL LOW (ref 30–100)

## 2022-08-23 NOTE — ED Provider Notes (Signed)
[facility]    CSN: [id number] Arrival date & time: 08/23/22  9147      History   Chief Complaint Chief Complaint  Patient presents with   Irregular Heart Beat    HPI Quiana Cobaugh is a 37 y.o. female comes to the urgent care with palpitations which started 2 days ago.  Symptoms started suddenly and was associated with some dizziness and shortness of breath.  She denies any chest pain or chest pressure.  Patient denies any nausea or vomiting.  No lightheadedness, syncope or near syncopal episodes.  No fever or chills.  No cough or sputum production.  No pain on swallowing.  No sore throat.  No weight changes.  No hot or cold intolerance.  No diarrhea.  No history of irregular heartbeat or heart disease.  Patient denies any over-the-counter medication use.  No drug supplements.  No weight loss medications.  HPI  Past Medical History:  Diagnosis Date   Medical history non-contributory     Patient Active Problem List   Diagnosis Date Noted   Language barrier 03/16/2020   Grand multipara 12/08/2019   Sickle cell trait (HCC) 10/13/2019    Past Surgical History:  Procedure Laterality Date   NO PAST SURGERIES      OB History     Gravida  8   Para  8   Term  8   Preterm      AB      Living  8      SAB      IAB      Ectopic      Multiple  0   Live Births  8            Home Medications    Prior to Admission medications   Medication Sig Start Date End Date Taking? Authorizing Provider  acetaminophen (TYLENOL) 325 MG tablet Take 2 tablets (650 mg total) by mouth every 4 (four) hours as needed (for pain scale < 4). Patient not taking: Reported on 08/28/2021 07/17/21   Venora Maples, MD  Blood Pressure Monitoring (BLOOD PRESSURE KIT) DEVI 1 kit by Does not apply route once a week. Patient not taking: Reported on 05/02/2021 03/14/21   Adam Phenix, MD  ibuprofen (ADVIL) 600 MG tablet Take 1 tablet (600 mg total) by mouth every 6 (six)  hours. Patient not taking: Reported on 08/28/2021 07/17/21   Venora Maples, MD  polyethylene glycol powder [facility]) 17 GM/SCOOP powder Take 17 g by mouth daily as needed. Patient not taking: Reported on 08/28/2021 07/17/21   Venora Maples, MD  Prenat-Fe Poly-Methfol-FA-DHA (VITAFOL ULTRA) 29-0.6-0.4-200 MG CAPS Take 1 capsule by mouth daily. Patient not taking: Reported on 08/28/2021 03/14/21   Adam Phenix, MD    Family History Family History  Problem Relation Age of Onset   Hypertension Neg Hx    Diabetes Neg Hx    Stroke Neg Hx     Social History Social History   Tobacco Use   Smoking status: Never   Smokeless tobacco: Never  Vaping Use   Vaping Use: Never used  Substance Use Topics   Alcohol use: Never   Drug use: Never     Allergies   Patient has no known allergies.   Review of Systems Review of Systems As per HPI  Physical Exam Triage Vital Signs ED Triage Vitals  Enc Vitals Group     BP 08/23/22 0917 114/77     Pulse Rate  08/23/22 0916 (!) 101     Resp 08/23/22 0916 18     Temp 08/23/22 0916 98.2 F (36.8 C)     Temp Source 08/23/22 0916 Oral     SpO2 08/23/22 0916 98 %     Weight --      Height --      Head Circumference --      Peak Flow --      Pain Score --      Pain Loc --      Pain Edu? --      Excl. in GC? --    No data found.  Updated Vital Signs BP 114/77 (BP Location: Left Arm)   Pulse 89   Temp 98.2 F (36.8 C) (Oral)   Resp 18   SpO2 98%   Visual Acuity Right Eye Distance:   Left Eye Distance:   Bilateral Distance:    Right Eye Near:   Left Eye Near:    Bilateral Near:     Physical Exam Vitals and nursing note reviewed.  Constitutional:      General: She is not in acute distress.    Appearance: Normal appearance. She is not ill-appearing.  HENT:     Mouth/Throat:     Mouth: Mucous membranes are moist.     Comments: No thyromegaly Eyes:     Extraocular Movements: Extraocular movements intact.      Pupils: Pupils are equal, round, and reactive to light.  Cardiovascular:     Rate and Rhythm: Normal rate and regular rhythm.     Pulses: Normal pulses.     Heart sounds: Normal heart sounds.  Pulmonary:     Effort: Pulmonary effort is normal.     Breath sounds: Normal breath sounds.  Skin:    General: Skin is warm and dry.  Neurological:     Mental Status: She is alert.      UC Treatments / Results  Labs (all labs ordered are listed, but only abnormal results are displayed) Labs Reviewed  CBC WITH DIFFERENTIAL/PLATELET  COMPREHENSIVE METABOLIC PANEL  TSH  VITAMIN D 25 HYDROXY (VIT D DEFICIENCY, FRACTURES)    EKG   Radiology No results found.  Procedures Procedures (including critical care time)  Medications Ordered in UC Medications - No data to display  Initial Impression / Assessment and Plan / UC Course  I have reviewed the triage vital signs and the nursing notes.  Pertinent labs & imaging results that were available during my care of the patient were reviewed by me and considered in my medical decision making (see chart for details).     1.  Palpitations: EKG shows sinus rhythm CBC, CMP, TSH and vitamin D level Patient is advised to increase oral fluid intake Will call patient with recommendations if labs are abnormal. If symptoms persist, patient may benefit from Holter monitor. Return precautions given. Final Clinical Impressions(s) / UC Diagnoses   Final diagnoses:  Palpitations     Discharge Instructions      Please increase oral fluid intake Will call you with recommendations if labs are abnormal Your EKG was reassuring If your symptoms persist, you will benefit from cardiology evaluation.     ED Prescriptions   None    PDMP not reviewed this encounter.   Merrilee Jansky, MD 08/23/22 1052

## 2022-08-23 NOTE — ED Triage Notes (Signed)
Pt does not appear in any distress at this time.

## 2022-08-23 NOTE — ED Triage Notes (Signed)
Pt is here for abnormal heart rate that started 2 days ago. Pt denies any drug use at this time. Pt does appear in any distress at this time.

## 2022-08-23 NOTE — Discharge Instructions (Signed)
Please increase oral fluid intake Will call you with recommendations if labs are abnormal Your EKG was reassuring If your symptoms persist, you will benefit from cardiology evaluation.

## 2022-11-26 ENCOUNTER — Encounter (HOSPITAL_COMMUNITY): Payer: Self-pay

## 2022-11-26 ENCOUNTER — Ambulatory Visit (HOSPITAL_COMMUNITY)
Admission: EM | Admit: 2022-11-26 | Discharge: 2022-11-26 | Disposition: A | Discharge status: Home / Self Care | Payer: Medicaid Other | Attending: Family Medicine | Admitting: Family Medicine

## 2022-11-26 DIAGNOSIS — M79605 Pain in left leg: Secondary | ICD-10-CM | POA: Diagnosis not present

## 2022-11-26 MED ORDER — TIZANIDINE HCL 4 MG PO TABS: 4.0000 mg | ORAL_TABLET | Freq: Three times a day (TID) | ORAL | 0 refills | Status: DC | PRN

## 2022-11-26 MED ORDER — IBUPROFEN 800 MG PO TABS: 800.0000 mg | ORAL_TABLET | Freq: Three times a day (TID) | ORAL | 0 refills | Status: DC | PRN

## 2022-11-26 NOTE — ED Provider Notes (Signed)
[facility]    CSN: [id number] Arrival date & time: 11/26/22  9528      History   Chief Complaint Chief Complaint  Patient presents with   Leg Pain    HPI Gina Walton is a 37 y.o. female.    Leg Pain Here for left thigh pain.  Is been going on for about 3 weeks.  No prior trauma that she notes.  No fever or rash or erythema.  She does note also some pain up into her left lateral hip.  She has maybe had some numbness in her left foot some.  She has not been taking any medication at home  Last menstrual cycle was July 15  She is no longer breast-feeding  No known drug allergies  Past Medical History:  Diagnosis Date   Medical history non-contributory     Patient Active Problem List   Diagnosis Date Noted   Language barrier 03/16/2020   Grand multipara 12/08/2019   Sickle cell trait (HCC) 10/13/2019    Past Surgical History:  Procedure Laterality Date   NO PAST SURGERIES      OB History     Gravida  8   Para  8   Term  8   Preterm      AB      Living  8      SAB      IAB      Ectopic      Multiple  0   Live Births  8            Home Medications    Prior to Admission medications   Medication Sig Start Date End Date Taking? Authorizing Provider  ibuprofen (ADVIL) 800 MG tablet Take 1 tablet (800 mg total) by mouth every 8 (eight) hours as needed (pain). 11/26/22  Yes Zenia Resides, MD  tiZANidine (ZANAFLEX) 4 MG tablet Take 1 tablet (4 mg total) by mouth every 8 (eight) hours as needed for muscle spasms. 11/26/22  Yes Zenia Resides, MD  Blood Pressure Monitoring (BLOOD PRESSURE KIT) DEVI 1 kit by Does not apply route once a week. Patient not taking: Reported on 05/02/2021 03/14/21   Adam Phenix, MD    Family History Family History  Problem Relation Age of Onset   Hypertension Neg Hx    Diabetes Neg Hx    Stroke Neg Hx     Social History Social History   Tobacco Use   Smoking status: Never    Smokeless tobacco: Never  Vaping Use   Vaping status: Never Used  Substance Use Topics   Alcohol use: Never   Drug use: Never     Allergies   Patient has no known allergies.   Review of Systems Review of Systems   Physical Exam Triage Vital Signs ED Triage Vitals  Encounter Vitals Group     BP 11/26/22 1138 113/83     Systolic BP Percentile --      Diastolic BP Percentile --      Pulse Rate 11/26/22 1138 85     Resp 11/26/22 1138 16     Temp 11/26/22 1138 98.3 F (36.8 C)     Temp Source 11/26/22 1138 Oral     SpO2 11/26/22 1138 98 %     Weight --      Height --      Head Circumference --      Peak Flow --      Pain Score  11/26/22 1145 5     Pain Loc --      Pain Education --      Exclude from Growth Chart --    No data found.  Updated Vital Signs BP 113/83 (BP Location: Right Arm)   Pulse 85   Temp 98.3 F (36.8 C) (Oral)   Resp 16   LMP 11/05/2022 (Exact Date)   SpO2 98%   Visual Acuity Right Eye Distance:   Left Eye Distance:   Bilateral Distance:    Right Eye Near:   Left Eye Near:    Bilateral Near:     Physical Exam Vitals reviewed.  Constitutional:      General: She is not in acute distress.    Appearance: She is not ill-appearing, toxic-appearing or diaphoretic.  Cardiovascular:     Rate and Rhythm: Normal rate and regular rhythm.  Musculoskeletal:     Comments: There is some mild tenderness of the anterior thigh musculature and the lateral hip.  Skin:    Capillary Refill: Capillary refill takes less than 2 seconds.     Coloration: Skin is not jaundiced or pale.  Neurological:     Mental Status: She is alert and oriented to person, place, and time.  Psychiatric:        Behavior: Behavior normal.      UC Treatments / Results  Labs (all labs ordered are listed, but only abnormal results are displayed) Labs Reviewed - No data to display  EKG   Radiology No results found.  Procedures Procedures (including critical care  time)  Medications Ordered in UC Medications - No data to display  Initial Impression / Assessment and Plan / UC Course  I have reviewed the triage vital signs and the nursing notes.  Pertinent labs & imaging results that were available during my care of the patient were reviewed by me and considered in my medical decision making (see chart for details).        Ibuprofen is sent in for pain and tizanidine is sent in for muscle relaxer.  She is no longer established with primary care, as it has been over 3 years since she saw [facility].  Assistance is requested to help establish with primary care Final Clinical Impressions(s) / UC Diagnoses   Final diagnoses:  Left leg pain     Discharge Instructions      Take ibuprofen 800 mg--1 tab every 8 hours as needed for pain.   Take tizanidine 4 mg--1 every 8 hours as needed for muscle spasms; this medication can cause dizziness and sleepiness         ED Prescriptions     Medication Sig Dispense Auth. Provider   ibuprofen (ADVIL) 800 MG tablet Take 1 tablet (800 mg total) by mouth every 8 (eight) hours as needed (pain). 21 tablet Catelyn Friel, Janace Aris, MD   tiZANidine (ZANAFLEX) 4 MG tablet Take 1 tablet (4 mg total) by mouth every 8 (eight) hours as needed for muscle spasms. 15 tablet Ananias Kolander, Janace Aris, MD      PDMP not reviewed this encounter.   Zenia Resides, MD 11/26/22 [phone]

## 2022-11-26 NOTE — Discharge Instructions (Signed)
Take ibuprofen 800 mg--1 tab every 8 hours as needed for pain.   Take tizanidine 4 mg--1 every 8 hours as needed for muscle spasms; this medication can cause dizziness and sleepiness   

## 2022-11-26 NOTE — ED Triage Notes (Signed)
Per Interpreter/Adam [phone]  Patient reports that she has had left upper leg pain x 3 weeks. Patient denies any injury.  Patient states pain is worse in her left leg when she is having sex and walking.  Patient denies taking any medication for her pain.

## 2023-03-07 ENCOUNTER — Encounter (HOSPITAL_COMMUNITY): Payer: Self-pay | Admitting: *Deleted

## 2023-03-07 ENCOUNTER — Ambulatory Visit (HOSPITAL_COMMUNITY)
Admission: EM | Admit: 2023-03-07 | Discharge: 2023-03-07 | Disposition: A | Discharge status: Home / Self Care | Payer: Medicaid Other | Attending: Internal Medicine | Admitting: Internal Medicine

## 2023-03-07 DIAGNOSIS — R42 Dizziness and giddiness: Secondary | ICD-10-CM | POA: Diagnosis not present

## 2023-03-07 DIAGNOSIS — H5509 Other forms of nystagmus: Secondary | ICD-10-CM

## 2023-03-07 DIAGNOSIS — G4489 Other headache syndrome: Secondary | ICD-10-CM

## 2023-03-07 MED ORDER — MECLIZINE HCL 12.5 MG PO TABS
12.5000 mg | ORAL_TABLET | Freq: Three times a day (TID) | ORAL | 0 refills | Status: AC | PRN
Start: 2023-03-07 — End: ?

## 2023-03-07 MED ORDER — FLUTICASONE PROPIONATE 50 MCG/ACT NA SUSP
1.0000 | Freq: Every day | NASAL | 0 refills | Status: AC
Start: 2023-03-07 — End: ?

## 2023-03-07 MED ORDER — GUAIFENESIN ER 600 MG PO TB12: 600.0000 mg | ORAL_TABLET | Freq: Two times a day (BID) | ORAL | 0 refills | Status: AC

## 2023-03-07 MED ORDER — IBUPROFEN 600 MG PO TABS: 600.0000 mg | ORAL_TABLET | Freq: Four times a day (QID) | ORAL | 0 refills | Status: DC | PRN

## 2023-03-07 MED ORDER — CETIRIZINE HCL 10 MG PO TABS: 10.0000 mg | ORAL_TABLET | Freq: Every day | ORAL | 0 refills | Status: DC

## 2023-03-07 NOTE — ED Provider Notes (Signed)
[facility]    CSN: [id number] Arrival date & time: 03/07/23  1219      History   Chief Complaint Chief Complaint  Patient presents with   Headache   Neck Pain    HPI Gina Walton is a 37 y.o. female comes to [facility] with 3-day history of left-sided neck pain, left upper extremity pain and left leg pain.  Patient describes [facility]ina pain as moderate severity, throbbing with no known relieving factors.  Patient has fullness in Gina left ear and experiences dizziness when she looks left.  She denies any trauma to [facility] head.  No extremity weakness, numbness or tingling.  Patient denies any blurry vision or double vision.  No ringing in [facility] ears.  No syncope or near syncopal episode.  Patient has nasal congestion but denies any postnasal drainage or cough.Marland Kitchen   HPI  Past Medical History:  Diagnosis Date   Medical history non-contributory     Patient Active Problem List   Diagnosis Date Noted   Language barrier 03/16/2020   Grand multipara 12/08/2019   Sickle cell trait (HCC) 10/13/2019    Past Surgical History:  Procedure Laterality Date   NO PAST SURGERIES      OB History     Gravida  8   Para  8   Term  8   Preterm      AB      Living  8      SAB      IAB      Ectopic      Multiple  0   Live Births  8            Home Medications    Prior to Admission medications   Medication Sig Start Date End Date Taking? Authorizing Provider  cetirizine (ZYRTEC ALLERGY) 10 MG tablet Take 1 tablet (10 mg total) by mouth daily. 03/07/23  Yes Kalib Bhagat, Britta Mccreedy, MD  fluticasone (FLONASE) 50 MCG/ACT nasal spray Place 1 spray into both nostrils daily. 03/07/23  Yes Morene Cecilio, Britta Mccreedy, MD  guaiFENesin (MUCINEX) 600 MG 12 hr tablet Take 1 tablet (600 mg total) by mouth 2 (two) times daily for 10 days. 03/07/23 03/17/23 Yes Tane Biegler, Britta Mccreedy, MD  ibuprofen (ADVIL) 600 MG tablet Take 1 tablet (600 mg total) by mouth every 6 (six) hours as needed.  03/07/23  Yes Reinaldo Helt, Britta Mccreedy, MD  meclizine (ANTIVERT) 12.5 MG tablet Take 1 tablet (12.5 mg total) by mouth 3 (three) times daily as needed for dizziness. 03/07/23  Yes Joby Richart, Britta Mccreedy, MD  tiZANidine (ZANAFLEX) 4 MG tablet Take 1 tablet (4 mg total) by mouth every 8 (eight) hours as needed for muscle spasms. 11/26/22   Zenia Resides, MD    Family History Family History  Problem Relation Age of Onset   Hypertension Neg Hx    Diabetes Neg Hx    Stroke Neg Hx     Social History Social History   Tobacco Use   Smoking status: Never   Smokeless tobacco: Never  Vaping Use   Vaping status: Never Used  Substance Use Topics   Alcohol use: Never   Drug use: Never     Allergies   Patient has no known allergies.   Review of Systems Review of Systems As per HPI  Physical Exam Triage Vital Signs ED Triage Vitals [03/07/23 1233]  Encounter Vitals Group     BP 112/85     Systolic BP Percentile  Diastolic BP Percentile      Pulse Rate 85     Resp 18     Temp 98.2 F (36.8 C)     Temp Source Oral     SpO2 98 %     Weight      Height      Head Circumference      Peak Flow      Pain Score      Pain Loc      Pain Education      Exclude from Growth Chart    No data found.  Updated Vital Signs BP 112/85 (BP Location: Right Arm)   Pulse 85   Temp 98.2 F (36.8 C) (Oral)   Resp 18   LMP 03/05/2023 (Exact Date)   SpO2 98%   Visual Acuity Right Eye Distance:   Left Eye Distance:   Bilateral Distance:    Right Eye Near:   Left Eye Near:    Bilateral Near:     Physical Exam Vitals and nursing note reviewed.  Constitutional:      Appearance: She is well-developed.  Eyes:     General: No visual field deficit. Cardiovascular:     Rate and Rhythm: Normal rate and regular rhythm.  Pulmonary:     Effort: Pulmonary effort is normal.     Breath sounds: Normal breath sounds.  Neurological:     Mental Status: She is alert.     Cranial Nerves: Cranial  nerve deficit present. No dysarthria.     Comments: Left nystagmus.  No lateralizing signs otherwise.      UC Treatments / Results  Labs (all labs ordered are listed, but only abnormal results are displayed) Labs Reviewed - No data to display  EKG   Radiology No results found.  Procedures Procedures (including critical care time)  Medications Ordered in UC Medications - No data to display  Initial Impression / Assessment and Plan / UC Course  I have reviewed [facility] triage vital signs and [facility] nursing notes.  Pertinent labs & imaging results that were available during my care of [facility] patient were reviewed by me and considered in my medical decision making (see chart for details).     1.  Dizziness with nystagmus: Meclizine as needed for dizziness Fluticasone nasal spray Mucinex twice daily Flonase and cetirizine as prescribed. Fluid intake advised Return precautions given Final Clinical Impressions(s) / UC Diagnoses   Final diagnoses:  Dizziness  Other headache syndrome  Lateral nystagmus     Discharge Instructions      Please take medications as prescribed Please maintain adequate hydration If your symptoms get worse feel free to return to urgent care to be reevaluated     ED Prescriptions     Medication Sig Dispense Auth. Provider   guaiFENesin (MUCINEX) 600 MG 12 hr tablet Take 1 tablet (600 mg total) by mouth 2 (two) times daily for 10 days. 20 tablet Lennette Fader, Britta Mccreedy, MD   ibuprofen (ADVIL) 600 MG tablet Take 1 tablet (600 mg total) by mouth every 6 (six) hours as needed. 30 tablet Uma Jerde, Britta Mccreedy, MD   fluticasone (FLONASE) 50 MCG/ACT nasal spray Place 1 spray into both nostrils daily. 16 g Tawanna Funk, Britta Mccreedy, MD   meclizine (ANTIVERT) 12.5 MG tablet Take 1 tablet (12.5 mg total) by mouth 3 (three) times daily as needed for dizziness. 30 tablet Ashon Rosenberg, Britta Mccreedy, MD   cetirizine (ZYRTEC ALLERGY) 10 MG tablet Take 1 tablet (10 mg total) by  mouth daily.  30 tablet Darrold Bezek, Britta Mccreedy, MD      PDMP not reviewed this encounter.   Merrilee Jansky, MD 03/07/23 [phone]

## 2023-03-07 NOTE — ED Triage Notes (Signed)
Professional interpreter used for clinical intake.    Pt states she has a headache, left sided neck pain that radiates down her left side X 3 days. She hasn't taken any meds.

## 2023-03-07 NOTE — Discharge Instructions (Addendum)
Please take medications as prescribed Please maintain adequate hydration If your symptoms get worse feel free to return to urgent care to be reevaluated

## 2023-04-03 ENCOUNTER — Ambulatory Visit: Payer: Self-pay | Admitting: Nurse Practitioner

## 2023-04-05 ENCOUNTER — Ambulatory Visit (INDEPENDENT_AMBULATORY_CARE_PROVIDER_SITE_OTHER): Payer: Medicaid Other | Admitting: Nurse Practitioner

## 2023-04-05 ENCOUNTER — Encounter: Payer: Self-pay | Admitting: Nurse Practitioner

## 2023-04-05 VITALS — BP 108/75 | HR 91 | Temp 97.2°F | Wt 216.0 lb

## 2023-04-05 DIAGNOSIS — Z1329 Encounter for screening for other suspected endocrine disorder: Secondary | ICD-10-CM | POA: Diagnosis not present

## 2023-04-05 DIAGNOSIS — H5712 Ocular pain, left eye: Secondary | ICD-10-CM

## 2023-04-05 DIAGNOSIS — R519 Headache, unspecified: Secondary | ICD-10-CM

## 2023-04-05 DIAGNOSIS — R0602 Shortness of breath: Secondary | ICD-10-CM | POA: Diagnosis not present

## 2023-04-05 DIAGNOSIS — H547 Unspecified visual loss: Secondary | ICD-10-CM

## 2023-04-05 DIAGNOSIS — R002 Palpitations: Secondary | ICD-10-CM | POA: Diagnosis not present

## 2023-04-05 DIAGNOSIS — R000 Tachycardia, unspecified: Secondary | ICD-10-CM

## 2023-04-05 DIAGNOSIS — Z13220 Encounter for screening for lipoid disorders: Secondary | ICD-10-CM

## 2023-04-05 MED ORDER — CYCLOBENZAPRINE HCL 10 MG PO TABS: 10.0000 mg | ORAL_TABLET | Freq: Three times a day (TID) | ORAL | 0 refills | Status: DC | PRN

## 2023-04-05 NOTE — Patient Instructions (Signed)
1. Nonintractable headache, unspecified chronicity pattern, unspecified headache type (Primary)  - cyclobenzaprine (FLEXERIL) 10 MG tablet; Take 1 tablet (10 mg total) by mouth 3 (three) times daily as needed for muscle spasms.  Dispense: 30 tablet; Refill: 0  2. Left eye pain  - Ambulatory referral to Ophthalmology  3. Decreased vision  - Ambulatory referral to Ophthalmology  4. Lipid screening  - Lipid Panel  5. Racing heart beat  - CBC - Comprehensive metabolic panel  6. Shortness of breath  - CBC - Comprehensive metabolic panel - Ambulatory referral to Cardiology  7. Heart palpitations  - CBC - Comprehensive metabolic panel - Ambulatory referral to Cardiology  8. Thyroid disorder screen  - TSH  Follow up:  Follow up in 3 months

## 2023-04-05 NOTE — Progress Notes (Signed)
Subjective   Patient ID: Gina Walton, female    DOB: [birth year], 37 y.o.   MRN: [id number]  Chief Complaint  Patient presents with   Establish Care    Head, neck and eye problems     Referring provider: Cain Saupe, MD  Mansirat Pugliese is a 37 y.o. female with Past Medical History: No date: Medical history non-contributory   HPI  Patient presents today to establish care and for physical.  She does have a history of left eye injury and was going to have surgery on this several years ago but that she found out she was pregnant.  She would like a referral back to ophthalmologist.  She states that she is having pain and associated headaches.  We will order Flexeril to help with headaches and place a referral to ophthalmology today.  Patient also complains of episodes of heart racing and shortness of breath.  We will check labs and refer her to cardiology for further evaluation. Denies f/c/s, n/v/d, hemoptysis, PND, leg swelling Denies chest pain or edema    No Known Allergies  Immunization History  Administered Date(s) Administered   Influenza,inj,Quad PF,6+ Mos 01/21/2018, 01/05/2020, 03/22/2021   Tdap 01/05/2020, 05/10/2021    Tobacco History: Social History   Tobacco Use  Smoking Status Never  Smokeless Tobacco Never   Counseling given: Not Answered   Outpatient Encounter Medications as of 04/05/2023  Medication Sig   cetirizine (ZYRTEC ALLERGY) 10 MG tablet Take 1 tablet (10 mg total) by mouth daily.   cyclobenzaprine (FLEXERIL) 10 MG tablet Take 1 tablet (10 mg total) by mouth 3 (three) times daily as needed for muscle spasms.   fluticasone (FLONASE) 50 MCG/ACT nasal spray Place 1 spray into both nostrils daily.   ibuprofen (ADVIL) 600 MG tablet Take 1 tablet (600 mg total) by mouth every 6 (six) hours as needed.   meclizine (ANTIVERT) 12.5 MG tablet Take 1 tablet (12.5 mg total) by mouth 3 (three) times daily as needed for dizziness. (Patient not taking:  Reported on 04/05/2023)   [DISCONTINUED] tiZANidine (ZANAFLEX) 4 MG tablet Take 1 tablet (4 mg total) by mouth every 8 (eight) hours as needed for muscle spasms. (Patient not taking: Reported on 04/05/2023)   No facility-administered encounter medications on file as of 04/05/2023.    Review of Systems  Review of Systems  Constitutional: Negative.   HENT: Negative.    Cardiovascular: Negative.   Gastrointestinal: Negative.   Allergic/Immunologic: Negative.   Neurological: Negative.   Psychiatric/Behavioral: Negative.       Objective:   BP 108/75   Pulse 91   Temp (!) 97.2 F (36.2 C)   Wt 216 lb (98 kg)   LMP 03/05/2023 (Exact Date)   SpO2 100%   BMI 40.81 kg/m   Wt Readings from Last 5 Encounters:  04/05/23 216 lb (98 kg)  08/28/21 224 lb (101.6 kg)  07/15/21 225 lb 1.4 oz (102.1 kg)  07/13/21 225 lb (102.1 kg)  07/06/21 224 lb 3.2 oz (101.7 kg)     Physical Exam Vitals and nursing note reviewed.  Constitutional:      General: She is not in acute distress.    Appearance: She is well-developed.  Cardiovascular:     Rate and Rhythm: Normal rate and regular rhythm.  Pulmonary:     Effort: Pulmonary effort is normal.     Breath sounds: Normal breath sounds.  Neurological:     Mental Status: She is alert and oriented to person, place, and  time.       Assessment & Plan:   Nonintractable headache, unspecified chronicity pattern, unspecified headache type -     Cyclobenzaprine HCl; Take 1 tablet (10 mg total) by mouth 3 (three) times daily as needed for muscle spasms.  Dispense: 30 tablet; Refill: 0  Left eye pain -     Ambulatory referral to Ophthalmology  Decreased vision -     Ambulatory referral to Ophthalmology  Lipid screening -     Lipid panel  Racing heart beat -     CBC -     Comprehensive metabolic panel  Shortness of breath -     CBC -     Comprehensive metabolic panel -     Ambulatory referral to Cardiology  Heart palpitations -      CBC -     Comprehensive metabolic panel -     Ambulatory referral to Cardiology  Thyroid disorder screen -     TSH     Return in about 3 months (around 07/04/2023).   Ivonne Andrew, NP 04/05/2023

## 2023-04-06 LAB — CBC
Hematocrit: 40.3 % (ref 34.0–46.6)
Hemoglobin: 12.6 g/dL (ref 11.1–15.9)
MCH: 25.3 pg — ABNORMAL LOW (ref 26.6–33.0)
MCHC: 31.3 g/dL — ABNORMAL LOW (ref 31.5–35.7)
MCV: 81 fL (ref 79–97)
Platelets: 295 10*3/uL (ref 150–450)
RBC: 4.99 x10E6/uL (ref 3.77–5.28)
RDW: 14.7 % (ref 11.7–15.4)
WBC: 5.1 10*3/uL (ref 3.4–10.8)

## 2023-04-06 LAB — COMPREHENSIVE METABOLIC PANEL
ALT: 20 [IU]/L (ref 0–32)
AST: 20 [IU]/L (ref 0–40)
Albumin: 4.4 g/dL (ref 3.9–4.9)
Alkaline Phosphatase: 50 [IU]/L (ref 44–121)
BUN/Creatinine Ratio: 13 (ref 9–23)
BUN: 11 mg/dL (ref 6–20)
Bilirubin Total: 0.4 mg/dL (ref 0.0–1.2)
CO2: 19 mmol/L — ABNORMAL LOW (ref 20–29)
Calcium: 9.2 mg/dL (ref 8.7–10.2)
Chloride: 105 mmol/L (ref 96–106)
Creatinine, Ser: 0.82 mg/dL (ref 0.57–1.00)
Globulin, Total: 3.2 g/dL (ref 1.5–4.5)
Glucose: 85 mg/dL (ref 70–99)
Potassium: 4.3 mmol/L (ref 3.5–5.2)
Sodium: 140 mmol/L (ref 134–144)
Total Protein: 7.6 g/dL (ref 6.0–8.5)
eGFR: 96 mL/min/{1.73_m2} (ref >59)

## 2023-04-06 LAB — LIPID PANEL
Chol/HDL Ratio: 3.5 {ratio} (ref 0.0–4.4)
Cholesterol, Total: 172 mg/dL (ref 100–199)
HDL: 49 mg/dL (ref >39)
LDL Chol Calc (NIH): 109 mg/dL — ABNORMAL HIGH (ref 0–99)
Triglycerides: 71 mg/dL (ref 0–149)
VLDL Cholesterol Cal: 14 mg/dL (ref 5–40)

## 2023-04-06 LAB — TSH: TSH: 2.16 u[IU]/mL (ref 0.450–4.500)

## 2023-06-24 ENCOUNTER — Emergency Department (HOSPITAL_COMMUNITY)
Admission: EM | Admit: 2023-06-24 | Discharge: 2023-06-25 | Disposition: A | Discharge status: Home / Self Care | Attending: Emergency Medicine | Admitting: Emergency Medicine

## 2023-06-24 ENCOUNTER — Emergency Department (HOSPITAL_COMMUNITY)

## 2023-06-24 ENCOUNTER — Other Ambulatory Visit: Payer: Self-pay

## 2023-06-24 ENCOUNTER — Ambulatory Visit (HOSPITAL_COMMUNITY): Admission: EM | Admit: 2023-06-24 | Discharge: 2023-06-24 | Disposition: A | Discharge status: Home / Self Care

## 2023-06-24 ENCOUNTER — Encounter (HOSPITAL_COMMUNITY): Payer: Self-pay

## 2023-06-24 ENCOUNTER — Encounter (HOSPITAL_COMMUNITY): Payer: Self-pay | Admitting: *Deleted

## 2023-06-24 DIAGNOSIS — R519 Headache, unspecified: Secondary | ICD-10-CM | POA: Insufficient documentation

## 2023-06-24 DIAGNOSIS — H5712 Ocular pain, left eye: Secondary | ICD-10-CM | POA: Diagnosis not present

## 2023-06-24 DIAGNOSIS — H5500 Unspecified nystagmus: Secondary | ICD-10-CM

## 2023-06-24 DIAGNOSIS — R29818 Other symptoms and signs involving the nervous system: Secondary | ICD-10-CM | POA: Diagnosis not present

## 2023-06-24 DIAGNOSIS — H539 Unspecified visual disturbance: Secondary | ICD-10-CM

## 2023-06-24 DIAGNOSIS — R9431 Abnormal electrocardiogram [ECG] [EKG]: Secondary | ICD-10-CM | POA: Diagnosis not present

## 2023-06-24 DIAGNOSIS — G459 Transient cerebral ischemic attack, unspecified: Secondary | ICD-10-CM | POA: Diagnosis not present

## 2023-06-24 DIAGNOSIS — H538 Other visual disturbances: Secondary | ICD-10-CM | POA: Diagnosis not present

## 2023-06-24 LAB — DIFFERENTIAL
Abs Immature Granulocytes: 0.02 10*3/uL (ref 0.00–0.07)
Basophils Absolute: 0 10*3/uL (ref 0.0–0.1)
Basophils Relative: 1 %
Eosinophils Absolute: 0.1 10*3/uL (ref 0.0–0.5)
Eosinophils Relative: 2 %
Immature Granulocytes: 0 %
Lymphocytes Relative: 29 %
Lymphs Abs: 1.9 10*3/uL (ref 0.7–4.0)
Monocytes Absolute: 0.4 10*3/uL (ref 0.1–1.0)
Monocytes Relative: 7 %
Neutro Abs: 4 10*3/uL (ref 1.7–7.7)
Neutrophils Relative %: 61 %

## 2023-06-24 LAB — SEDIMENTATION RATE: Sed Rate: 16 mm/h (ref 0–22)

## 2023-06-24 LAB — CBC
HCT: 40.6 % (ref 36.0–46.0)
Hemoglobin: 13.1 g/dL (ref 12.0–15.0)
MCH: 25.1 pg — ABNORMAL LOW (ref 26.0–34.0)
MCHC: 32.3 g/dL (ref 30.0–36.0)
MCV: 77.8 fL — ABNORMAL LOW (ref 80.0–100.0)
Platelets: 266 10*3/uL (ref 150–400)
RBC: 5.22 MIL/uL — ABNORMAL HIGH (ref 3.87–5.11)
RDW: 13.9 % (ref 11.5–15.5)
WBC: 6.5 10*3/uL (ref 4.0–10.5)
nRBC: 0 % (ref 0.0–0.2)

## 2023-06-24 LAB — COMPREHENSIVE METABOLIC PANEL
ALT: 27 U/L (ref 0–44)
AST: 22 U/L (ref 15–41)
Albumin: 4 g/dL (ref 3.5–5.0)
Alkaline Phosphatase: 37 U/L — ABNORMAL LOW (ref 38–126)
Anion gap: 11 (ref 5–15)
BUN: 6 mg/dL (ref 6–20)
CO2: 22 mmol/L (ref 22–32)
Calcium: 9.7 mg/dL (ref 8.9–10.3)
Chloride: 106 mmol/L (ref 98–111)
Creatinine, Ser: 0.83 mg/dL (ref 0.44–1.00)
GFR, Estimated: >60 mL/min (ref >60)
Glucose, Bld: 86 mg/dL (ref 70–99)
Potassium: 4 mmol/L (ref 3.5–5.1)
Sodium: 139 mmol/L (ref 135–145)
Total Bilirubin: 0.7 mg/dL (ref 0.0–1.2)
Total Protein: 7.8 g/dL (ref 6.5–8.1)

## 2023-06-24 LAB — HCG, SERUM, QUALITATIVE: Preg, Serum: NEGATIVE

## 2023-06-24 LAB — C-REACTIVE PROTEIN: CRP: 1.2 mg/dL — ABNORMAL HIGH (ref <1.0)

## 2023-06-24 LAB — PROTIME-INR
INR: 1 (ref 0.8–1.2)
Prothrombin Time: 13.8 s (ref 11.4–15.2)

## 2023-06-24 MED ORDER — TETRACAINE HCL 0.5 % OP SOLN
1.0000 [drp] | Freq: Once | OPHTHALMIC | Status: AC
  Administered 2023-06-24: 1 [drp] via OPHTHALMIC
  Filled 2023-06-24: qty 4

## 2023-06-24 MED ORDER — METOCLOPRAMIDE HCL 5 MG/ML IJ SOLN
10.0000 mg | Freq: Once | INTRAMUSCULAR | Status: AC
  Administered 2023-06-24: 10 mg via INTRAVENOUS
  Filled 2023-06-24: qty 2

## 2023-06-24 MED ORDER — LACTATED RINGERS IV BOLUS
1000.0000 mL | Freq: Once | INTRAVENOUS | Status: AC
  Administered 2023-06-24: 1000 mL via INTRAVENOUS

## 2023-06-24 MED ORDER — KETOROLAC TROMETHAMINE 15 MG/ML IJ SOLN
15.0000 mg | Freq: Once | INTRAMUSCULAR | Status: AC
  Administered 2023-06-24: 15 mg via INTRAVENOUS
  Filled 2023-06-24: qty 1

## 2023-06-24 MED ORDER — IOHEXOL 350 MG/ML SOLN
75.0000 mL | Freq: Once | INTRAVENOUS | Status: AC | PRN
  Administered 2023-06-24: 75 mL via INTRAVENOUS

## 2023-06-24 MED ORDER — DIPHENHYDRAMINE HCL 50 MG/ML IJ SOLN
12.5000 mg | Freq: Once | INTRAMUSCULAR | Status: AC
  Administered 2023-06-24: 12.5 mg via INTRAVENOUS
  Filled 2023-06-24: qty 1

## 2023-06-24 MED ORDER — OXYCODONE-ACETAMINOPHEN 5-325 MG PO TABS
1.0000 | ORAL_TABLET | ORAL | Status: DC | PRN
  Administered 2023-06-24: 1 via ORAL
  Filled 2023-06-24: qty 1

## 2023-06-24 NOTE — ED Provider Notes (Signed)
 [place] EMERGENCY DEPARTMENT AT [facility] Provider Note   CSN: [id number] Arrival date & time: 06/24/23  1217     History  Chief Complaint  Patient presents with   Headache    Gina Walton is a 38 y.o. female.  The history is provided by the patient. The history is limited by a language barrier. A language interpreter was used.  Headache Associated symptoms: eye pain   Patient presenting for headache, left eye pain, and blurry vision from left eye.  This has been ongoing for the past 2 days.  She was seen urgent care prior to arrival.  She was sent to the ED for further evaluation.  On arrival, patient states that her current headache has improved.  She does have intermittent sharp pains in her left eye.  She endorses ongoing blurry vision from her left eye.  She has had recent dizziness.  She denies any other recent associated symptoms.     Home Medications Prior to Admission medications   Medication Sig Start Date End Date Taking? Authorizing Provider  cetirizine (ZYRTEC ALLERGY) 10 MG tablet Take 1 tablet (10 mg total) by mouth daily. 03/07/23   Lamptey, Britta Mccreedy, MD  cyclobenzaprine (FLEXERIL) 10 MG tablet Take 1 tablet (10 mg total) by mouth 3 (three) times daily as needed for muscle spasms. 04/05/23   Ivonne Andrew, NP  fluticasone (FLONASE) 50 MCG/ACT nasal spray Place 1 spray into both nostrils daily. 03/07/23   Merrilee Jansky, MD  ibuprofen (ADVIL) 600 MG tablet Take 1 tablet (600 mg total) by mouth every 6 (six) hours as needed. 03/07/23   LampteyBritta Mccreedy, MD  meclizine (ANTIVERT) 12.5 MG tablet Take 1 tablet (12.5 mg total) by mouth 3 (three) times daily as needed for dizziness. Patient not taking: Reported on 04/05/2023 03/07/23   Merrilee Jansky, MD      Allergies    Patient has no known allergies.    Review of Systems   Review of Systems  Eyes:  Positive for pain and visual disturbance.  Neurological:  Positive for headaches.  All  other systems reviewed and are negative.   Physical Exam Updated Vital Signs BP 109/81 (BP Location: Right Arm)   Pulse 86   Temp 98.2 F (36.8 C) (Oral)   Resp 18   LMP 06/23/2023 (Exact Date)   SpO2 100%   Breastfeeding No  Physical Exam Vitals and nursing note reviewed.  Constitutional:      General: She is not in acute distress.    Appearance: She is well-developed. She is not ill-appearing, toxic-appearing or diaphoretic.  HENT:     Head: Normocephalic and atraumatic.     Mouth/Throat:     Mouth: Mucous membranes are moist.  Eyes:     Extraocular Movements: Extraocular movements intact.     Right eye: Nystagmus present.     Left eye: Nystagmus present.     Conjunctiva/sclera: Conjunctivae normal.     Comments: Left pupil is constricted and displaced to the 11 o'clock position.  Visual acuity of right eye is normal.  Visual acuity of left eye is 20/100.  IOP's are normal bilaterally.  Cardiovascular:     Rate and Rhythm: Normal rate and regular rhythm.  Pulmonary:     Effort: Pulmonary effort is normal. No respiratory distress.  Abdominal:     Palpations: Abdomen is soft.     Tenderness: There is no abdominal tenderness.  Musculoskeletal:  General: No swelling.     Cervical back: Normal range of motion and neck supple.  Skin:    General: Skin is warm and dry.     Capillary Refill: Capillary refill takes less than 2 seconds.  Neurological:     Mental Status: She is alert and oriented to person, place, and time.     Cranial Nerves: No dysarthria or facial asymmetry.     Sensory: No sensory deficit.     Motor: No weakness.  Psychiatric:        Mood and Affect: Mood normal.        Behavior: Behavior normal.     ED Results / Procedures / Treatments   Labs (all labs ordered are listed, but only abnormal results are displayed) Labs Reviewed  CBC - Abnormal; Notable for the following components:      Result Value   RBC 5.22 (*)    MCV 77.8 (*)    MCH 25.1  (*)    All other components within normal limits  COMPREHENSIVE METABOLIC PANEL - Abnormal; Notable for the following components:   Alkaline Phosphatase 37 (*)    All other components within normal limits  C-REACTIVE PROTEIN - Abnormal; Notable for the following components:   CRP 1.2 (*)    All other components within normal limits  PROTIME-INR  DIFFERENTIAL  HCG, SERUM, QUALITATIVE  SEDIMENTATION RATE    EKG EKG Interpretation Date/Time:  Monday June 24 2023 18:59:23 EST Ventricular Rate:  92 PR Interval:  144 QRS Duration:  80 QT Interval:  370 QTC Calculation: 457 R Axis:   28  Text Interpretation: Normal sinus rhythm Low voltage QRS Nonspecific T wave abnormality Confirmed by Gloris Manchester ([id number]) on 06/24/2023 11:11:18 PM  Radiology No results found.  Procedures Procedures    Medications Ordered in ED Medications  oxyCODONE-acetaminophen (PERCOCET/ROXICET) 5-325 MG per tablet 1 tablet (1 tablet Oral Given 06/24/23 1441)  tetracaine (PONTOCAINE) 0.5 % ophthalmic solution 1 drop (1 drop Both Eyes Given by Other 06/24/23 1828)  iohexol (OMNIPAQUE) 350 MG/ML injection 75 mL (75 mLs Intravenous Contrast Given 06/24/23 2027)  ketorolac (TORADOL) 15 MG/ML injection 15 mg (15 mg Intravenous Given 06/24/23 2250)  metoCLOPramide (REGLAN) injection 10 mg (10 mg Intravenous Given 06/24/23 2253)  diphenhydrAMINE (BENADRYL) injection 12.5 mg (12.5 mg Intravenous Given 06/24/23 2247)  lactated ringers bolus 1,000 mL (1,000 mLs Intravenous New Bag/Given 06/24/23 2300)    ED Course/ Medical Decision Making/ A&P                                 Medical Decision Making Amount and/or Complexity of Data Reviewed Labs: ordered. Radiology: ordered.  Risk Prescription drug management.   This patient presents to the ED for concern of headache and blurry vision, this involves an extensive number of treatment options, and is a complaint that carries with it a high risk of complications and morbidity.   The differential diagnosis includes CVA, complex migraine, GCA, acute glaucoma, neoplasm   Co morbidities that complicate the patient evaluation  N/A   Additional history obtained:  Additional history obtained from N/A External records from outside source obtained and reviewed including EMR   Lab Tests:  I Ordered, and personally interpreted labs.  The pertinent results include: Normal hemoglobin, no leukocytosis, normal kidney function, normal electrolytes, normal inflammatory markers   Imaging Studies ordered:  I ordered imaging studies including CTA head and neck, MRI brain  I independently visualized and interpreted imaging which showed (pending at time of signout) I agree with the radiologist interpretation   Cardiac Monitoring: / EKG:  The patient was maintained on a cardiac monitor.  I personally viewed and interpreted the cardiac monitored which showed an underlying rhythm of: Sinus rhythm   Consultations Obtained:  I requested consultation with the ophthalmologist, Dr. Zenaida Niece,  and discussed lab and imaging findings as well as pertinent plan - they recommend: Pupillary findings and nystagmus likely chronic.  Outpatient follow-up is recommended.   Problem List / ED Course / Critical interventions / Medication management  Patient presents for 2 days of headache, left eye pain, and left eye blurry vision.  Seen urgent care prior to arrival and sent to the ED for further evaluation.  On exam, patient is overall well-appearing.  She does have some findings on her left eye, most notably a constricted and offset pupil.  She does have decreased visual acuity from her left eye.  IOP's are normal bilaterally.  Bilateral nystagmus is present.  Patient does not seem to have any other focal neurologic deficits.  When speaking with the patient, it is unclear if these ocular findings are chronic.  Stroke workup was initiated.  Ophthalmology was consulted.  Ophthalmologist on-call, Dr.  Zenaida Niece, came and evaluated the patient in the ED. she feels that left eye findings are likely chronic.  She recommends outpatient follow-up from ophthalmology standpoint.  On reassessment, patient sleeping comfortably.  At time of signout, CTA and MRI results are pending.  Care of patient was signed out from ED provider. I ordered medication including IV fluids, Reglan, Toradol, Benadryl for headache Reevaluation of the patient after these medicines showed that the patient improved I have reviewed the patients home medicines and have made adjustments as needed   Social Determinants of Health:  Limited English speaking        Final Clinical Impression(s) / ED Diagnoses Final diagnoses:  Bad headache    Rx / DC Orders ED Discharge Orders     None         Gloris Manchester, MD 06/24/23 2356

## 2023-06-24 NOTE — ED Notes (Signed)
 Patient transported to MRI

## 2023-06-24 NOTE — Discharge Instructions (Addendum)
 Please go to the Emergency room for your concerns. The fact that you are having headaches, vision changes and your physical exam was concerning today and  requires further evaluation and management that we cannot offer in Urgent Care.   [facility] Emergency Room [address] [place], [place]

## 2023-06-24 NOTE — ED Provider Notes (Signed)
 [facility]    CSN: [id number] Arrival date & time: 06/24/23  1324      History   Chief Complaint Chief Complaint  Patient presents with   Headache   Eye Pain    HPI Gina Walton is a 38 y.o. female.   HPI  Interpretor services were made available to patient during UC visit. Translator: Lucendia Herrlich [phone]   She reports having headaches and left eye pain for the past 2 day   Pain level and character: 5/10 in severity and dull in  character  Associated symptoms: she reports visual acuity changes in her left eye- states it seems like there is a "grey thing" over it. She feels her visual acuity has changed in her left eye  She reports she sometimes has dizziness and just before her period starts her left leg will go numb Her period just ended yesterday   She has an IUD  She does not smoke She denies recent head trauma or accidents   Interventions: Tylenol -helped a bit but she is still having pain     Past Medical History:  Diagnosis Date   Medical history non-contributory     Patient Active Problem List   Diagnosis Date Noted   Language barrier 03/16/2020   Grand multipara 12/08/2019   Sickle cell trait (HCC) 10/13/2019    Past Surgical History:  Procedure Laterality Date   NO PAST SURGERIES      OB History     Gravida  8   Para  8   Term  8   Preterm      AB      Living  8      SAB      IAB      Ectopic      Multiple  0   Live Births  8            Home Medications    Prior to Admission medications   Medication Sig Start Date End Date Taking? Authorizing Provider  cetirizine (ZYRTEC ALLERGY) 10 MG tablet Take 1 tablet (10 mg total) by mouth daily. 03/07/23   Lamptey, Britta Mccreedy, MD  cyclobenzaprine (FLEXERIL) 10 MG tablet Take 1 tablet (10 mg total) by mouth 3 (three) times daily as needed for muscle spasms. 04/05/23   Ivonne Andrew, NP  fluticasone (FLONASE) 50 MCG/ACT nasal spray Place 1 spray into both nostrils  daily. 03/07/23   Merrilee Jansky, MD  ibuprofen (ADVIL) 600 MG tablet Take 1 tablet (600 mg total) by mouth every 6 (six) hours as needed. 03/07/23   LampteyBritta Mccreedy, MD  meclizine (ANTIVERT) 12.5 MG tablet Take 1 tablet (12.5 mg total) by mouth 3 (three) times daily as needed for dizziness. Patient not taking: Reported on 04/05/2023 03/07/23   Merrilee Jansky, MD    Family History Family History  Problem Relation Age of Onset   Hypertension Neg Hx    Diabetes Neg Hx    Stroke Neg Hx     Social History Social History   Tobacco Use   Smoking status: Never   Smokeless tobacco: Never  Vaping Use   Vaping status: Never Used  Substance Use Topics   Alcohol use: Never   Drug use: Never     Allergies   Patient has no known allergies.   Review of Systems Review of Systems  Constitutional:  Negative for chills and fever.  Eyes:  Positive for pain and visual disturbance.  Respiratory:  Negative for shortness of breath.   Cardiovascular:  Negative for chest pain and palpitations.  Gastrointestinal:  Negative for nausea and vomiting.  Neurological:  Positive for dizziness and headaches. Negative for facial asymmetry, weakness, light-headedness and numbness.     Physical Exam Triage Vital Signs ED Triage Vitals  Encounter Vitals Group     BP 06/24/23 1107 118/89     Systolic BP Percentile --      Diastolic BP Percentile --      Pulse Rate 06/24/23 1107 83     Resp 06/24/23 1107 18     Temp 06/24/23 1107 98.7 F (37.1 C)     Temp src --      SpO2 06/24/23 1107 98 %     Weight --      Height --      Head Circumference --      Peak Flow --      Pain Score 06/24/23 1105 5     Pain Loc --      Pain Education --      Exclude from Growth Chart --    No data found.  Updated Vital Signs BP 118/89   Pulse 83   Temp 98.7 F (37.1 C)   Resp 18   LMP 06/23/2023   SpO2 98%   Visual Acuity Right Eye Distance:   Left Eye Distance:   Bilateral Distance:     Right Eye Near:   Left Eye Near:    Bilateral Near:     Physical Exam Vitals reviewed.  Constitutional:      General: She is awake.     Appearance: Normal appearance. She is well-developed and well-groomed.  HENT:     Head: Normocephalic and atraumatic.  Eyes:     General: Lids are normal. Gaze aligned appropriately.     Extraocular Movements:     Right eye: Nystagmus present.     Left eye: Nystagmus present.     Pupils: Pupils are unequal.     Right eye: Pupil is round, reactive and not sluggish.     Left eye: Pupil is not round and not reactive.     Comments: Left pupil is nonreactive and is not centered in iris- pupil is located in upper medial aspect of iris and appears to have gray colored growth or formation over superior aspect.  She does have notable pinguecula along inferior aspect of the left eye as well  Pulmonary:     Effort: Pulmonary effort is normal.  Musculoskeletal:     Cervical back: Normal range of motion.  Neurological:     Mental Status: She is alert and oriented to person, place, and time.     GCS: GCS eye subscore is 4. GCS verbal subscore is 5. GCS motor subscore is 6.     Comments: Comments: MENTAL STATUS: AAOx3, memory intact, fund of knowledge appropriate   LANG/SPEECH: Naming and repetition intact, fluent, no dysarthria, follows 3-step commands, answers questions appropriately     CRANIAL NERVES:   II: Right pupil is PERRLA. Left is not centered in iris and is not reactive   III, IV, VI: EOM is staggered, nystagmus present    V: normal sensation in V1, V2, and V3 segments bilaterally   VII: no asymmetry, no nasolabial fold flattening   VIII: normal hearing to speech   IX, X: normal palatal elevation, no uvular deviation   XI: 5/5 head turn and 5/5 shoulder shrug bilaterally   XII: midline tongue protrusion  MOTOR:  5/5 bilateral grip strength 5/5 strength dorsiflexion/plantarflexion b/l     STATION: normal stance, no truncal ataxia    GAIT: Normal; patient able to tip-toe, heel-walk.   Psychiatric:        Behavior: Behavior is cooperative.      UC Treatments / Results  Labs (all labs ordered are listed, but only abnormal results are displayed) Labs Reviewed - No data to display  EKG   Radiology No results found.  Procedures Procedures (including critical care time)  Medications Ordered in UC Medications - No data to display  Initial Impression / Assessment and Plan / UC Course  I have reviewed the triage vital signs and the nursing notes.  Pertinent labs & imaging results that were available during my care of the patient were reviewed by me and considered in my medical decision making (see chart for details).      Final Clinical Impressions(s) / UC Diagnoses   Final diagnoses:  Pain of left eye  Acute intractable headache, unspecified headache type  Vision changes  Nystagmus    Acute, new concern Patient presents today with concerns for 2 days of left-sided headache that is 5/10 and dull in nature.  She also reports that she is having dizziness, denies nausea or vomiting.  She reports that she is also concerned for vision changes and decreased vision.  She reports that there is something obscuring her vision in her eye.  Physical exam is notable for bilateral horizontal nystagmus, staggered EOMs, displaced left pupil that is nonreactive.  Patient is not able to tell me if her left pupil is in its normal state versus if this is an acute change.  Other aspects of neuro exam are overall reassuring.  Reviewed with patient that since her symptoms are new and are not improving with home measures I do recommend that she goes to the emergency room for further evaluation and management.  I have concern for potential vision loss, potential stroke, complicated migraine. Patient is in agreement with recommendation and declines EMS services for transportation as she drove her self here.     Discharge  Instructions      Please go to the Emergency room for your concerns. The fact that you are having headaches, vision changes and your physical exam was concerning today and  requires further evaluation and management that we cannot offer in Urgent Care.   [facility] Emergency Room [address] [place], [place]      ED Prescriptions   None    PDMP not reviewed this encounter.   Providence Crosby, PA-C 06/24/23 1211

## 2023-06-24 NOTE — ED Triage Notes (Signed)
 PT reports HA and Rt eye pain for 2 days.

## 2023-06-24 NOTE — Consult Note (Signed)
 Ophthalmology Consult Note   Subjective: Patient reports having eye pain for 2 days. Reports her vision in the left eye is not as good as the right. It has been a gradual decrease. She was supposed to have surgery in the left eye previously, per family member possibly pterygium surgery. Reports her pupil has always been irregular- does not know when. She states her nystagmus is long-standing. Denies any oscillopsia.  Objective: Vital signs in last 24 hours: Temp:  [97.8 F (36.6 C)-98.7 F (37.1 C)] 98.2 F (36.8 C) (03/03 1922) Pulse Rate:  [83-88] 86 (03/03 1922) Resp:  [16-18] 18 (03/03 1922) BP: (109-126)/(81-92) 109/81 (03/03 1922) SpO2:  [98 %-100 %] 100 % (03/03 1922) Weight change:     Intake/Output from previous day: No intake/output data recorded. Intake/Output this shift: No intake/output data recorded.  Base Eye Exam  Visual Acuity (ETDRS)   Right Left  Near [place] 20/25 20/40  Dist ph      Tonometry (Tonopen)   Right Left  Pressure 13 18   Pupils   APD  Right None  Left None, corectopia toward 11:00, miosis   Visual Fields   Right Left   Full Full   Extraocular Movement   Right Left   Full - horizontal nystagmus Full- horizontal nystagmus   Neuro/Psych  Oriented x3: Yes  Mood/Affect: Normal         Slit Lamp and Fundus Exam   External Exam   Right Left  External Normal Normal   Slit Lamp Exam   Right Left  Lids/Lashes Normal Normal  Conjunctiva/Sclera White and quiet Large yellowish-white mound from 5:30 to 9:30. ?lipoma vs ?pterygium  Cornea Clear Clear  Anterior Chamber Deep and quiet Deep and quiet  Iris Grossly normal Grossly normal  Lens NS NS       Fundus Exam   Right Clear  Posterior Vitreous  Clear, no vitritis  Disc  Pink, sharp margins  C/D Ratio  0.2  Macula  Flat  Vessels  Superior fibrotic membrane at arcade to optic nerve  Periphery  Attached     Recent Labs    06/24/23 1730  WBC 6.5  HGB 13.1  HCT  40.6  NA 139  K 4.0  CL 106  CO2 22  BUN 6  CREATININE 0.83    Studies/Results: No results found.  Medications: I have reviewed the patient's current medications.  Assessment/Plan:  Decreased vision, left eye -20/40 uncorrected, decreased per patient and compared to right eye. Recommend further work-up outpatient including OCT Mac, refraction and topography. No findings on exam that explains her decreased vision.   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 Patient does report episodes of redness which correlates with eye pain. Suspect patient has inflammatory disorder. Inflammation can cause pupil abnormality, however patient dilates well without PAS. Does have a fibrotic membrane in posterior segment that would support this diagnosis. Does have a corneal scar where the pupil is located, possible remote history of trauma however patient denies this.  Nystagmus, both eyes -Horizontal. Patient does not note any oscillopsia and reports she has had nystagmus forever. Probably congenital.  Recommend further work-up outpatient for decreased vision.  Follow up with Dr. Sharyn Dross at [facility] Address: [address] [place], [place] [address] Phone: [phone]. Fax: [phone]     LOS: 0 days   Gina Walton 06/24/2023

## 2023-06-24 NOTE — ED Triage Notes (Signed)
 Pt was seen at [facility] today and was told to come to ER d/t symptoms. Pt has had a headache for two days with some vision changes. Pt says it is hard to explain her vision concerns but she sees the nurse and no spots. Pt denies any weakness. Pt says she has a headache behind her eyes and forehead area.

## 2023-06-25 NOTE — Discharge Instructions (Signed)
 The eye doctor that saw you tonight would like to see you again in the office.  Her contact information is below.  Call the telephone number to set up that appointment.  Take ibuprofen and Tylenol as needed for headaches.  Return to the emergency department for any new or worsening symptoms of concern.

## 2023-07-04 ENCOUNTER — Ambulatory Visit: Payer: Self-pay | Admitting: Nurse Practitioner

## 2024-04-08 ENCOUNTER — Encounter (HOSPITAL_COMMUNITY): Payer: Self-pay

## 2024-04-08 ENCOUNTER — Ambulatory Visit (HOSPITAL_COMMUNITY)
Admission: EM | Admit: 2024-04-08 | Discharge: 2024-04-08 | Disposition: A | Discharge status: Home / Self Care | Source: Home / Self Care | Attending: Family Medicine | Admitting: Family Medicine

## 2024-04-08 DIAGNOSIS — J029 Acute pharyngitis, unspecified: Secondary | ICD-10-CM | POA: Diagnosis not present

## 2024-04-08 DIAGNOSIS — M545 Low back pain, unspecified: Secondary | ICD-10-CM | POA: Diagnosis not present

## 2024-04-08 DIAGNOSIS — R21 Rash and other nonspecific skin eruption: Secondary | ICD-10-CM | POA: Diagnosis not present

## 2024-04-08 LAB — POC COVID19/FLU A&B COMBO
Covid Antigen, POC: NEGATIVE
Influenza A Antigen, POC: NEGATIVE
Influenza B Antigen, POC: NEGATIVE

## 2024-04-08 LAB — POCT RAPID STREP A (OFFICE): Rapid Strep A Screen: NEGATIVE

## 2024-04-08 MED ORDER — TRIAMCINOLONE ACETONIDE 0.1 % EX CREA: 1.0000 | TOPICAL_CREAM | Freq: Two times a day (BID) | CUTANEOUS | 0 refills | Status: AC

## 2024-04-08 MED ORDER — ACETAMINOPHEN 325 MG PO TABS
ORAL_TABLET | ORAL | Status: AC
  Filled 2024-04-08: qty 3

## 2024-04-08 MED ORDER — ACETAMINOPHEN 325 MG PO TABS
975.0000 mg | ORAL_TABLET | Freq: Once | ORAL | Status: AC
  Administered 2024-04-08: 09:00:00 975 mg via ORAL

## 2024-04-08 MED ORDER — AMOXICILLIN 875 MG PO TABS: 875.0000 mg | ORAL_TABLET | Freq: Two times a day (BID) | ORAL | 0 refills | Status: AC

## 2024-04-08 MED ORDER — PREDNISONE 20 MG PO TABS: 40.0000 mg | ORAL_TABLET | Freq: Every day | ORAL | 0 refills | Status: AC

## 2024-04-08 NOTE — ED Provider Notes (Signed)
 [facility]   [id number] 04/08/24 Arrival Time: 0802  ASSESSMENT & PLAN:  1. Sore throat   2. Acute left-sided low back pain without sciatica   3. Rash and nonspecific skin eruption    No signs of peritonsillar abscess. Exam suspicious for bacterial tonsillitis. Will tx with antibiotic.  Trial of prednsone for throat and back. Back likely MSK related. Trial of steroid cream for rash on hands.  Meds ordered this encounter  Medications   acetaminophen  (TYLENOL ) tablet 975 mg   amoxicillin  (AMOXIL ) 875 MG tablet    Sig: Take 1 tablet (875 mg total) by mouth 2 (two) times daily for 10 days.    Dispense:  20 tablet    Refill:  0   predniSONE  (DELTASONE ) 20 MG tablet    Sig: Take 2 tablets (40 mg total) by mouth daily.    Dispense:  10 tablet    Refill:  0   triamcinolone  cream (KENALOG ) 0.1 %    Sig: Apply 1 Application topically 2 (two) times daily.    Dispense:  30 g    Refill:  0    Results for orders placed or performed during the hospital encounter of 04/08/24  POC rapid strep A   Collection Time: 04/08/24  8:45 AM  Result Value Ref Range   Rapid Strep A Screen Negative Negative  POC Covid19/Flu A&B Antigen   Collection Time: 04/08/24  8:54 AM  Result Value Ref Range   Influenza A Antigen, POC Negative Negative   Influenza B Antigen, POC Negative Negative   Covid Antigen, POC Negative Negative   Labs Reviewed  POCT RAPID STREP A (OFFICE)  POC COVID19/FLU A&B COMBO    OTC analgesics and throat care as needed  Instructed to finish full 10 day course of antibiotics. Will follow up if not showing significant improvement over the next 24-48 hours. Work note provided.  Reviewed expectations re: course of current medical issues. Questions answered. Outlined signs and symptoms indicating need for more acute intervention. Patient verbalized understanding. After Visit Summary given.   SUBJECTIVE:  Gina Walton is a 38 y.o. female who reports a sore  throat and headache; abrupt onset; x 2-3 days; painful swallowing; subj fever/chills. Denies resp symptoms. Also reports L low back pain on/off for a few weeks; relates this to work. Tylenol  without much help.  OBJECTIVE:  Vitals:   04/08/24 0815  BP: 97/74  Pulse: 96  Resp: 18  Temp: (!) 100.5 F (38.1 C)  TempSrc: Oral  SpO2: 98%    General appearance: alert; no distress HEENT: throat with moderate erythema and with exudative tonsillar hypertrophy; uvula is midline Neck: supple with FROM; small cervical LAD bilat Lungs: speaks full sentences without difficulty; unlabored Abd: soft; non-tender Skin: darkened and dry skin over dorsal hands mainly around joints; warm and dry Psychological: alert and cooperative; normal mood and affect  Allergies[1]  Past Medical History:  Diagnosis Date   Medical history non-contributory    Social History   Socioeconomic History   Marital status: Married    Spouse name: Not on file   Number of children: Not on file   Years of education: Not on file   Highest education level: Not on file  Occupational History   Not on file  Tobacco Use   Smoking status: Never   Smokeless tobacco: Never  Vaping Use   Vaping status: Never Used  Substance and Sexual Activity   Alcohol use: Never   Drug use: Never  Sexual activity: Yes    Partners: Male    Birth control/protection: None  Other Topics Concern   Not on file  Social History Narrative   Not on file   Social Drivers of Health   Tobacco Use: Low Risk (04/08/2024)   Patient History    Smoking Tobacco Use: Never    Smokeless Tobacco Use: Never    Passive Exposure: Not on file  Financial Resource Strain: Not on file  Food Insecurity: Not on file  Transportation Needs: Not on file  Physical Activity: Not on file  Stress: Not on file  Social Connections: Not on file  Intimate Partner Violence: Not on file  Depression (PHQ2-9): Low Risk (04/05/2023)   Depression (PHQ2-9)    PHQ-2  Score: 1  Alcohol Screen: Not on file  Housing: Not on file  Utilities: Not on file  Health Literacy: Not on file   Family History  Problem Relation Age of Onset   Hypertension Neg Hx    Diabetes Neg Hx    Stroke Neg Hx             [1] No Known Allergies    Rolinda Rogue, MD 04/08/24 [phone]

## 2024-04-08 NOTE — ED Triage Notes (Addendum)
 Pt has c/o having a sore throat, headache and back pain that started x 3 days. Has taken tylenol  with little relief. Last dose of tylenol  yesterday night.  Interpreter: Zachary [phone]
